# Patient Record
Sex: Male | Born: 1950 | Race: Black or African American | Hispanic: No | Marital: Married | Smoking: Never smoker
Health system: Southern US, Community
[De-identification: ages and names within clinical notes are randomized; demographics above are authoritative.]

## PROBLEM LIST (undated history)

## (undated) DIAGNOSIS — C801 Malignant (primary) neoplasm, unspecified: Secondary | ICD-10-CM

## (undated) HISTORY — PX: COLONOSCOPY: SHX174

## (undated) HISTORY — PX: JOINT REPLACEMENT: SHX530

---

## 2008-03-05 ENCOUNTER — Inpatient Hospital Stay (HOSPITAL_COMMUNITY): Admission: RE | Admit: 2008-03-05 | Discharge: 2008-03-08 | Payer: Self-pay | Admitting: Orthopedic Surgery

## 2008-04-16 ENCOUNTER — Inpatient Hospital Stay (HOSPITAL_COMMUNITY): Admission: RE | Admit: 2008-04-16 | Discharge: 2008-04-19 | Payer: Self-pay | Admitting: Orthopedic Surgery

## 2008-04-23 ENCOUNTER — Emergency Department: Payer: Self-pay | Admitting: Emergency Medicine

## 2008-04-29 ENCOUNTER — Ambulatory Visit (HOSPITAL_BASED_OUTPATIENT_CLINIC_OR_DEPARTMENT_OTHER): Admission: RE | Admit: 2008-04-29 | Discharge: 2008-04-30 | Payer: Self-pay | Admitting: Orthopedic Surgery

## 2009-09-13 IMAGING — CR DG CHEST 2V
2 series · 2 of 2 positions shown · non-contrast
Comparison: None

CLINICAL DATA: DJD.  Preadmission for knee replacement

CHEST - 2 VIEW

[view not recorded (1 of 2)]
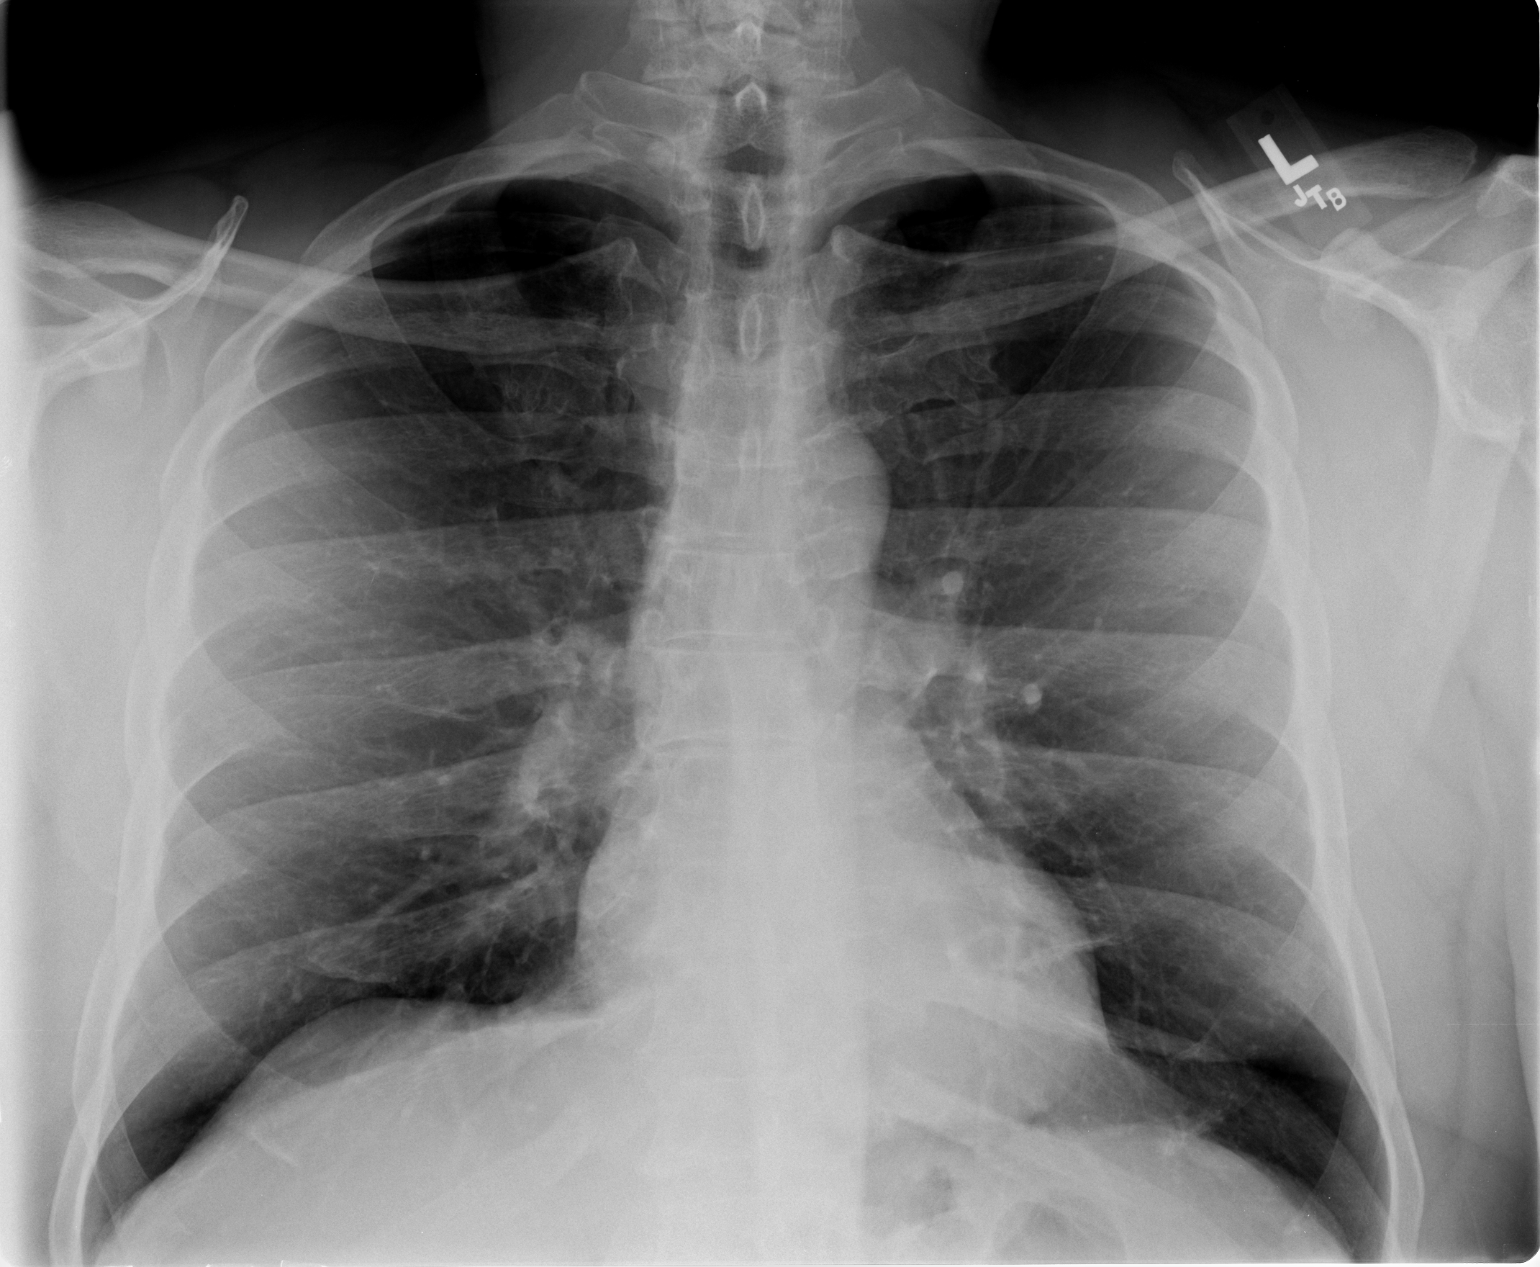

[view not recorded (2 of 2)]
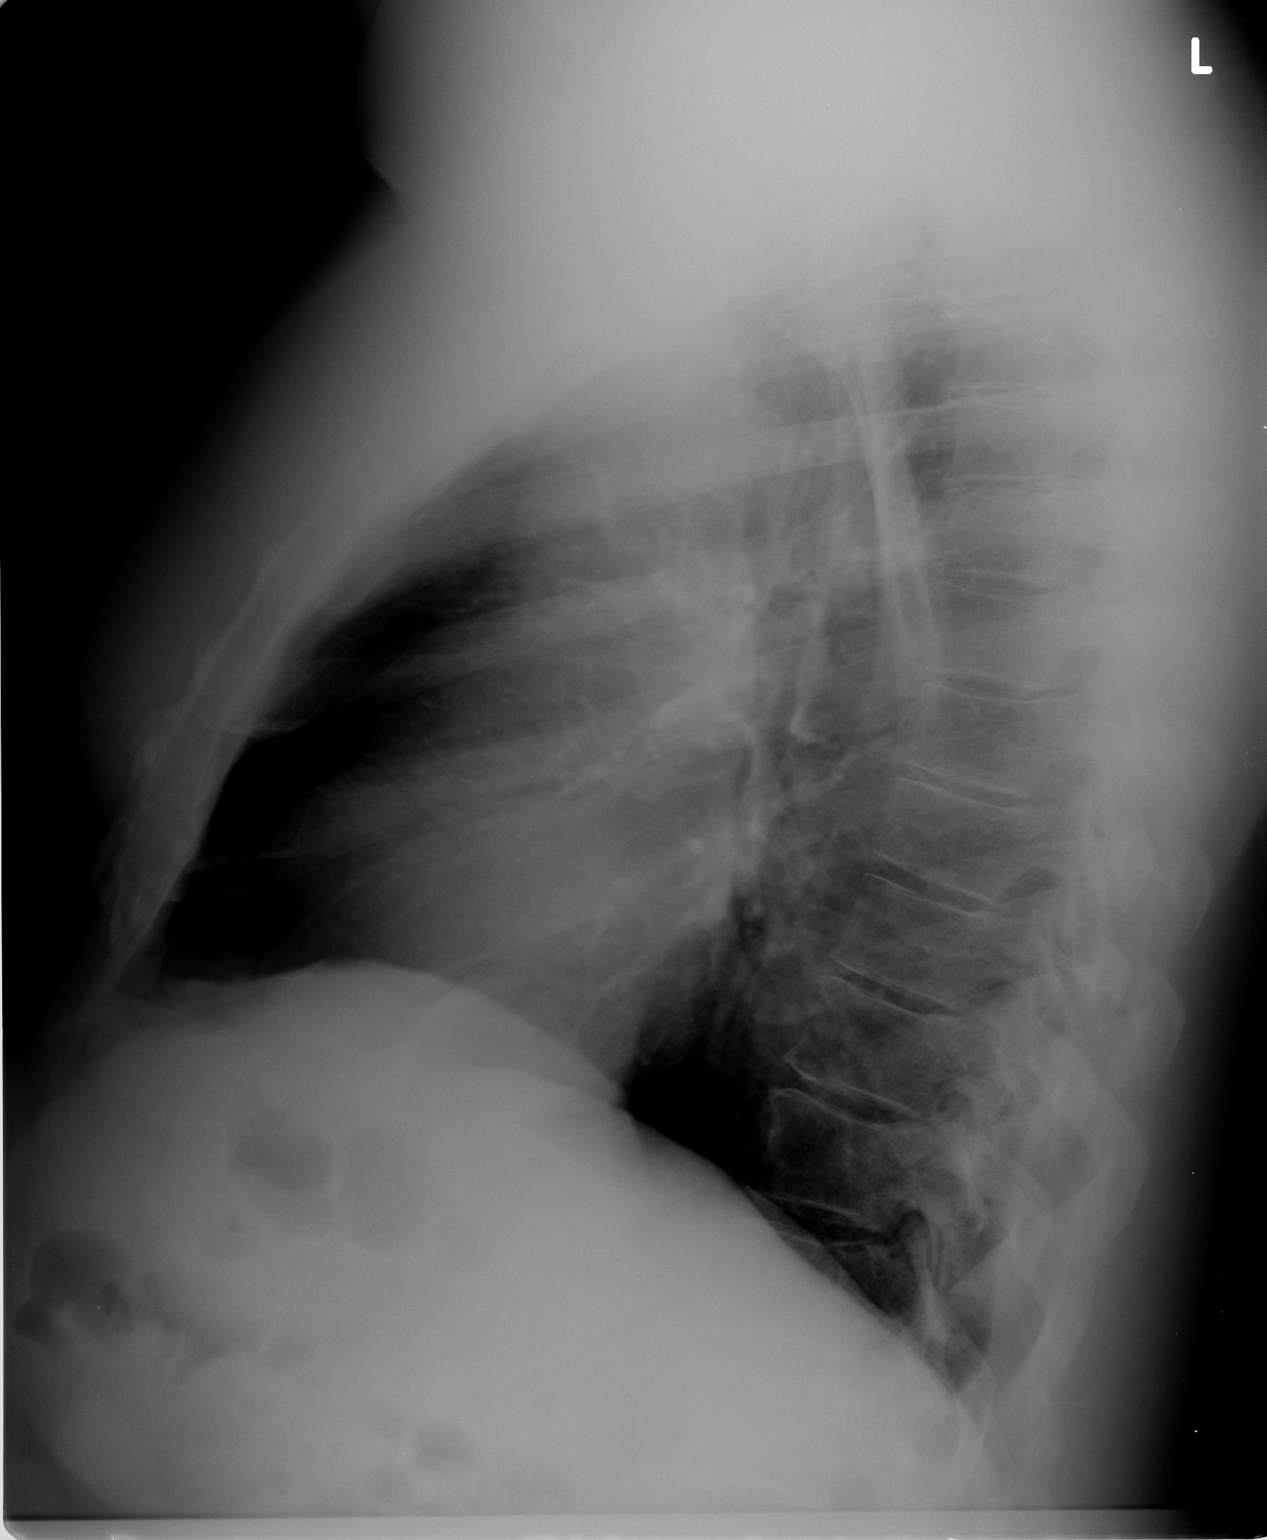

[2 of 2 positions shown; findings below may reference images not displayed]

FINDINGS: The heart and mediastinal contours are within normal
limits.  Both lungs are well expanded and clear.  No focal airspace
opacity, effusion, edema or pneumothorax is identified.  The bony
thorax is unremarkable.
IMPRESSION: No evidence of acute cardiopulmonary disease.

## 2009-09-18 IMAGING — CR DG KNEE 1-2V PORT*L*
2 series · 2 of 2 positions shown · non-contrast
Comparison: None

CLINICAL DATA: Left knee replacement.

PORTABLE LEFT KNEE - 1-2 VIEW

[view not recorded (1 of 2)]
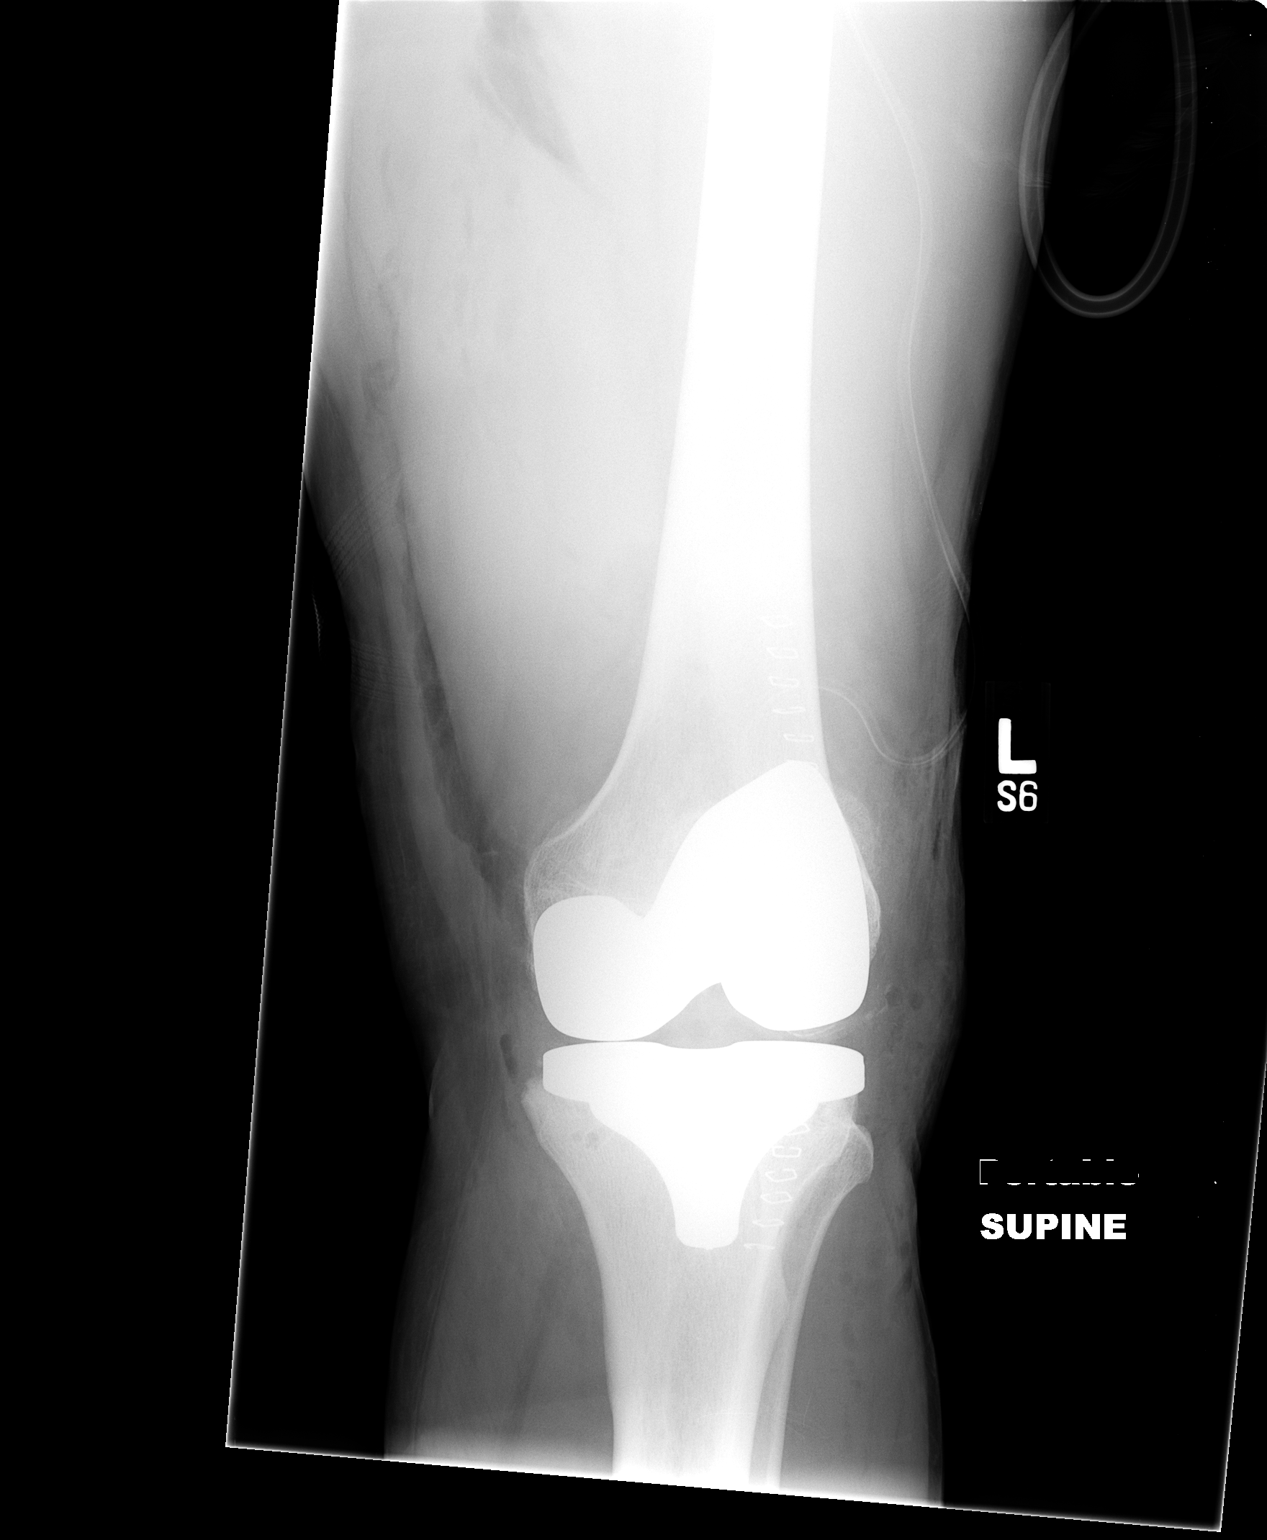

[view not recorded (2 of 2)]
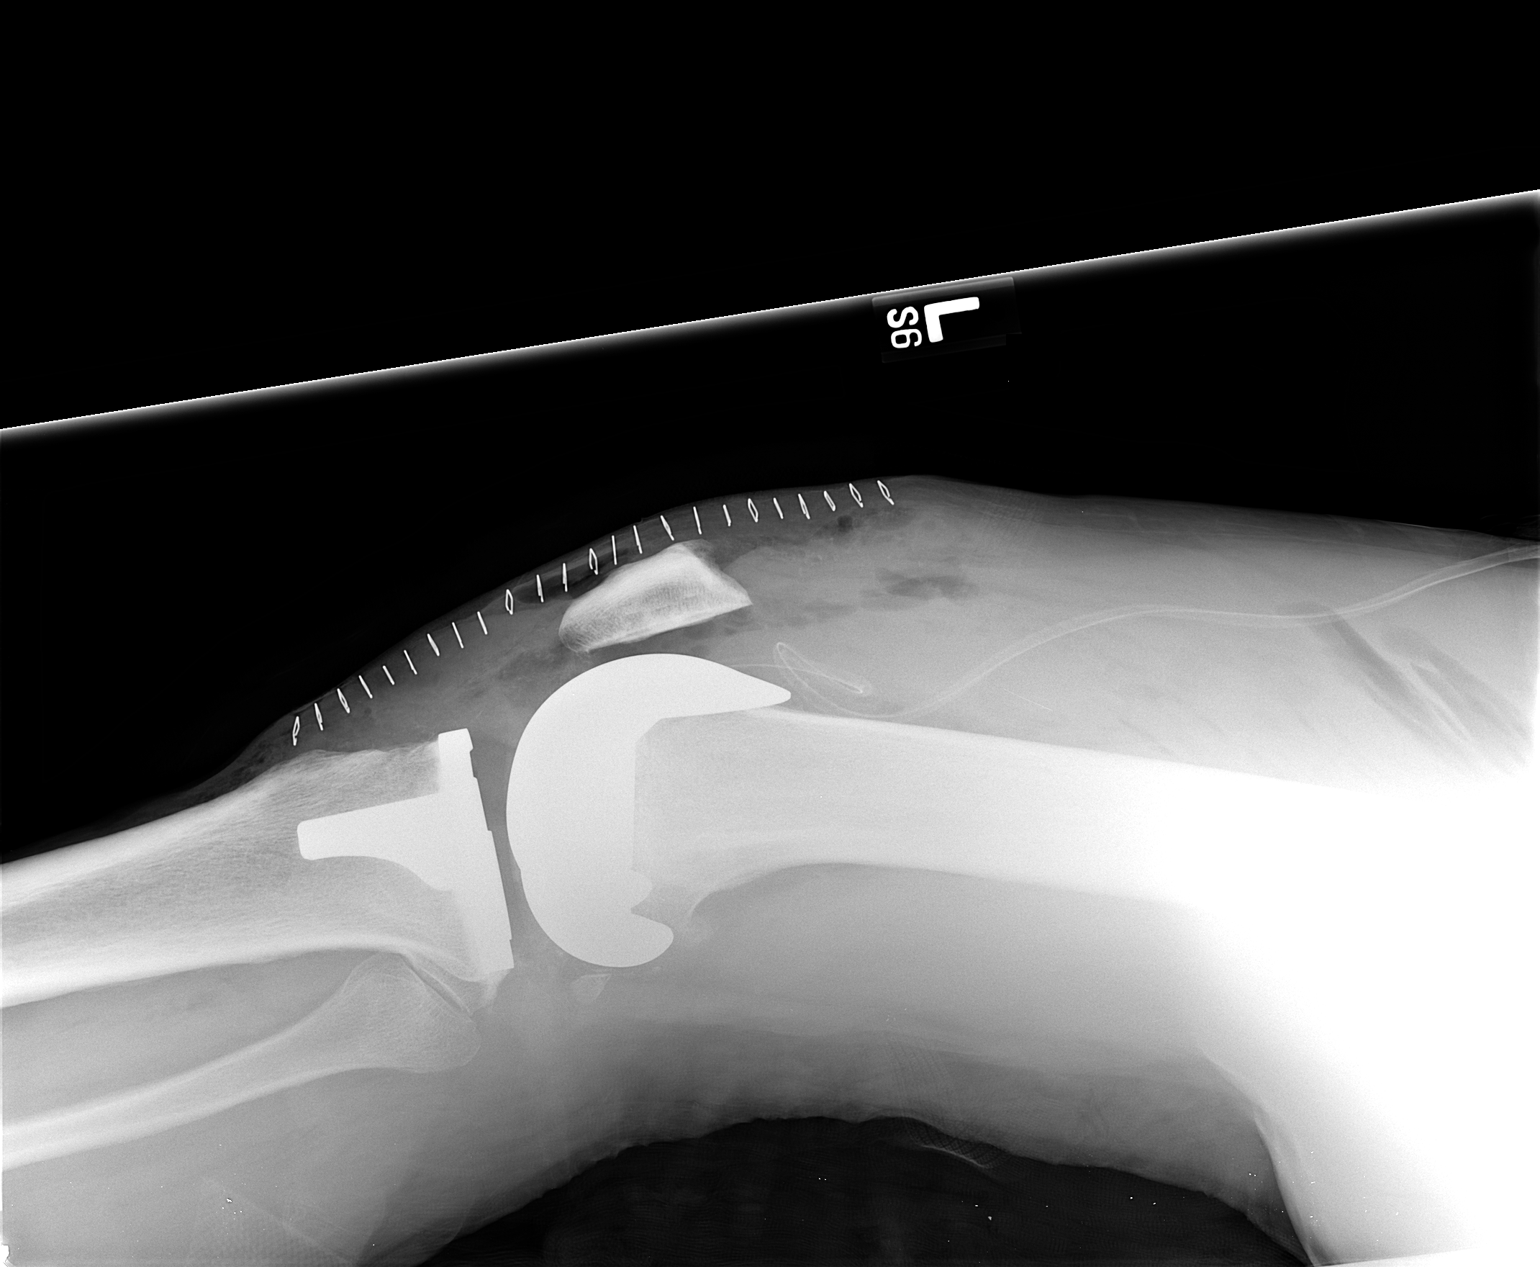

[2 of 2 positions shown; findings below may reference images not displayed]

FINDINGS: The patient is status post left knee replacement.  Joint
and soft tissue gas noted.  Soft tissue drain in place.  No bony
complicating feature.
IMPRESSION: Left knee replacement without complicating feature.

## 2009-10-30 IMAGING — CR DG KNEE 1-2V PORT*R*
2 series · 2 of 2 positions shown · non-contrast
Comparison: None

CLINICAL DATA: Right knee replacement.

PORTABLE RIGHT KNEE - 1-2 VIEW

[ap/obl knee]
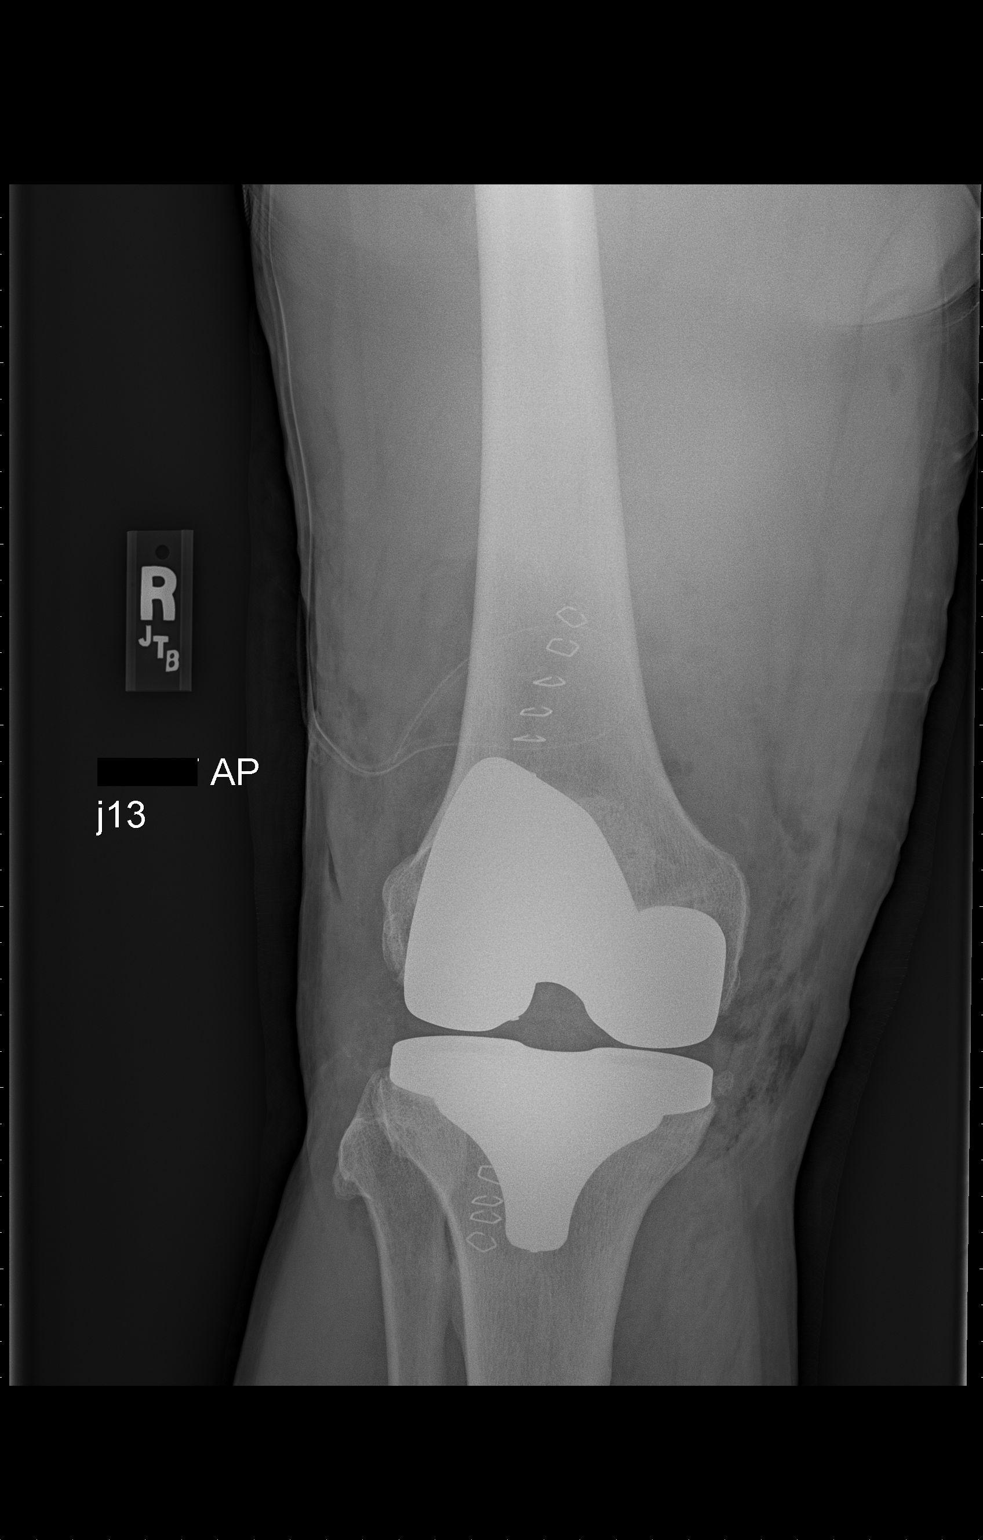

[knee lat]
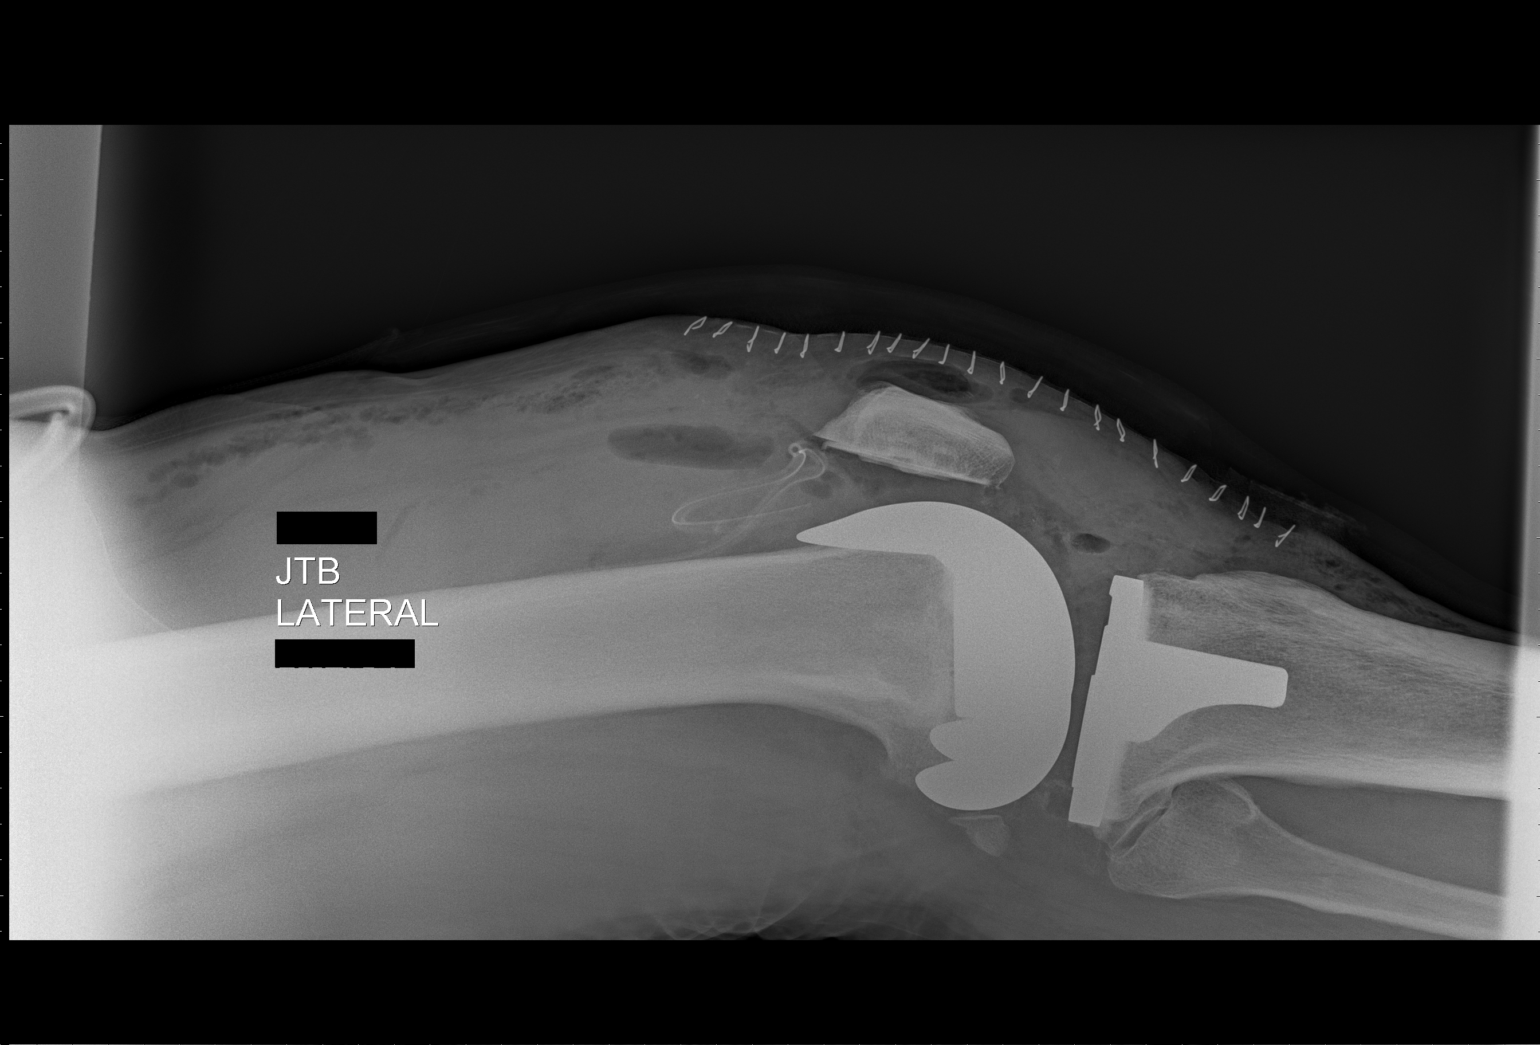

[2 of 2 positions shown; findings below may reference images not displayed]

FINDINGS: The patient is status post right total knee arthroplasty.
Surgical drain and skin staples are in place.  Fluid and air are
seen in the knee joint and overlying soft tissues.
IMPRESSION: Right total knee arthroplasty without immediate complicating
feature.

## 2010-12-07 NOTE — Op Note (Signed)
[name], [name]                 ACCOUNT NO.:  [id number]   MEDICAL RECORD NO.:  [id number]          PATIENT TYPE:  INP   LOCATION:  5028                         FACILITY:  [facility]   PHYSICIAN:  Loreta Ave, M.D. DATE OF BIRTH:  [birth year]   DATE OF PROCEDURE:  03/05/2008  DATE OF DISCHARGE:                               OPERATIVE REPORT   PREOPERATIVE DIAGNOSES:  1. End-stage degenerative arthritis, left knee.  2. 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   POSTOPERATIVE DIAGNOSIS:  1. End-stage degenerative arthritis, left knee.  2. 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   PROCEDURE:  Left total knee replacement utilizing Stryker triathlon  prosthesis.  Modified minimally invasive approach.  Soft tissue  balancing with medial capsule release.  Cemented pegged posterior  stabilized #7 femoral component.  Cemented #7 tibial component with a 11-  mm posterior stabilized polyethylene insert.  Cemented pegged medial  offset resurfacing 40-mm patellar component.   SURGEON:  Loreta Ave, MD   ASSISTANT:  Genene Churn. Barry Dienes, [place], present throughout the entire case  necessary for timely completion of procedure.   ANESTHESIA:  General.   BLOOD LOSS:  Minimal.   SPECIMENS:  None.   CULTURES:  None.   COMPLICATIONS:  None.   DRESSINGS:  Soft compressor with knee immobilizer.   DRAINS:  Hemovac x1.   TOURNIQUET TIME:  1 hour and 30 minutes.   PROCEDURE:  The patient brought to the operating room, placed on the  operating table in supine position.  After adequate anesthesia had been  obtained, left knee examined.  About 7 degrees or more flexion  contracture.  Alignment and more than 7 degrees of varus barely  correctable to neutral.  Further flexion barely 90 degrees.  Tourniquet  applied.  Prepped and draped in usual sterile fashion.  Exsanguinated  with elevation and Esmarch, tourniquet  inflated to 350 mmHg.  Straight  incision above the patella down to tibial tubercle.  Medial arthrotomy  to the superomedial border of the patella and then vastus splitting  preserving quad tendon.  Knee exposed.  Medial soft tissue released.  Extensive periarticular spurs, remnants of menisci, cruciate ligament,  loose bodies all removed.  Distal femur exposed.  Intramedullary guide  placed.  I did a 12-mm resection distal femur, taking more of lateral  than medial because of the medial defect.  Epicondylar axis marked.  Size #7 component.  Appropriate jigs put in place where it was sized,  cut, and then fitted for a #7 posterior stabilized femoral component,  which gave good coverage, alignment, and fitting.  Trial removed.  Attention turned to the tibia.  Extramedullary guide.  0-degree cut.  Resection down below the defect on the medial side.  Good bone stock.  Size #7 component.  Periarticular spurs removed.  Some thickened  subchondral cortical bone on the medial side treated with multiple  drilling.  Recess examined all spurs, all loose bodies removed.  Trials  put in place.  #7 on  the femur, #7 on the tibia which gave good fitting,  alignment, and coverage.  With the 11-mm insert and with the soft tissue  balancing, I had good stability in flexion/extension, good mechanical  axis, and good motion.  Patella was exposed.  The posterior 11 mm was  removed.  Periarticular spurs removed.  Drilled and sized and fitted for  a 40-mm patellar component with excellent patellofemoral tracking at  completion.  Tibia was marked for appropriate rotation and then hand  reamed for definitive components.  All trials have been removed.  Copious irrigation with a pulse irrigating device.  Cement was then  prepared and placed on all components.  The tibial and femoral  components hammered in place and excessive cement removed.  Polyethylene  attached to tibia.  Patellar component firmly cemented.   Once the cement  hardened, the knee was reexamined.  A little soft tissue redundancy in  lateral gutter causing some popping that was debrided out.  Once that  was complete, I was very pleased with the alignment and stability.  Full  extension, just slight hyperextension, full flexion, good stability,  good patellofemoral tracking.  Wound irrigated.  Hemovac placed and  brought out through a separate stab wound.  Arthrotomy closed with #1  Vicryl.  Skin and subcutaneous tissue with Vicryl and staples.  Knee  injected with Marcaine.  Hemovac clamp.  Sterile compressive dressing  applied.  Tourniquet deflated and removed.  Knee immobilizer applied.  Anesthesia reversed.  Brought to recovery room.  Tolerated surgery well.  No complications.      Loreta Ave, M.D.  Electronically Signed     DFM/MEDQ  D:  03/05/2008  T:  03/06/2008  Job:  [id number]

## 2010-12-07 NOTE — Op Note (Signed)
[name], [name]                 ACCOUNT NO.:  [id number]   MEDICAL RECORD NO.:  [id number]          PATIENT TYPE:  AMB   LOCATION:  DSC                          FACILITY:  [facility]   PHYSICIAN:  Loreta Ave, M.D. DATE OF BIRTH:  Oct 25, 1950   DATE OF PROCEDURE:  04/29/2008  DATE OF DISCHARGE:                               OPERATIVE REPORT   PREOPERATIVE DIAGNOSIS:  Status post right total knee replacement with  postop significant hemarthrosis and clotting with tense effusion  secondary to Coumadin therapy.   POSTOPERATIVE DIAGNOSIS:  Status post right total knee replacement with  postop significant hemarthrosis and clotting with tense effusion  secondary to Coumadin therapy.   PROCEDURE:  Right knee exam with extensive lavage, evacuation of blood  and clotted hematoma arthroscopically.  Manipulation.  Removal of  staples.   SURGEON:  Loreta Ave, MD   ASSISTANT:  Genene Churn. Barry Dienes, [place]   ANESTHESIA:  General.   BLOOD LOSS:  Minimal.   TOURNIQUET:  Not employed.   SPECIMENS:  None.   CULTURE:  The bloody aspirate was sent for aerobic and anaerobic  culture.   COMPLICATIONS:  None.   DRESSINGS:  Soft compressive.   PROCEDURE:  The patient brought to the operating room, placed on the  operating table in supine position.  After adequate anesthesia had been  obtained, right knee examined.  A relatively tense effusion.  Almost  full extension, flexion only about 30 degrees limited by swelling.  Knee  stable.  Prepped and draped in usual sterile fashion.  Three portals  created, 1 superolateral, 1 each medial, lateral, parapatellar.  Inflow  catheter introduced and this evacuated some blood, but not lot.  Arthroscope introduced, knee inspected.  Extensive clots throughout the  entire knee.  This was evacuated utilizing the shaver device of the  cannula system from the arthroscope.  Extensive clots throughout all  recesses from the suprapatellar pouch all way down to  the medial and  lateral gutters and throughout the knee evacuated.  Nothing that looked  like purulence at all.  This all appeared to have been a bleed from his  Coumadin, which got too elevated and then was brought back down to  reasonable levels causing the hemarthrosis to clot off.  All components  looked good.  Once I had thoroughly and convincingly evacuated all clots  available throughout the knee, it was reexamined.  Marked improve in  appearance, not tense at all.  I could then gently manipulate the knee  going from full extension to 125 degrees of flexion very easily.  Good  stability.  Arthroscope reintroduced.  Further clots all evaluated.  Once I was convinced, this was thoroughly cleaned out.  Hemovac was  placed through the  superolateral portal and sewn in place.  The knee injected Marcaine and  Hemovac clamp.  The other portal was closed.  We then removed all of the  staples and put on Steri-Strips.  Sterile compressive dressing applied.  Anesthesia reversed.  Brought to recovery room.  Tolerated the surgery  well without complications.  Loreta Ave, M.D.  Electronically Signed     DFM/MEDQ  D:  04/29/2008  T:  04/30/2008  Job:  [id number]

## 2010-12-07 NOTE — Op Note (Signed)
NAMEFALCON, MCCASKEY                 ACCOUNT NO.:  [id number]   MEDICAL RECORD NO.:  [id number]          PATIENT TYPE:  INP   LOCATION:  5019                         FACILITY:  [facility]   PHYSICIAN:  Loreta Ave, M.D. DATE OF BIRTH:  [birth year]   DATE OF PROCEDURE:  04/16/2008  DATE OF DISCHARGE:                               OPERATIVE REPORT   PREOPERATIVE DIAGNOSES:  1. End-stage degenerative arthritis, right knee.  Varus alignment,      flexion contracture, limited flexion.  2. 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   POSTOPERATIVE DIAGNOSES:  1. End-stage degenerative arthritis, right knee.  Varus alignment,      flexion contracture, limited flexion.  2. 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   PROCEDURES:  1. Right knee total knee replacement with Stryker Triathlon      components.  Modified minimally invasive approach.  Cemented pegged      posterior stabilized #7 femoral component.  Cemented #7 tibial      component with an 11-mm polyethylene insert.  Resurfacing 40 mm x      11 mm pegged cemented medial offset patellar component.  Extensive      removal of loose bodies and soft tissue release.  2. Exam, manipulation, injection, left knee.   SURGEON:  Loreta Ave, MD   ASSISTANT:  Genene Churn. Barry Dienes, [place], present throughout the entire case  necessary for timely completion of the procedure.   ANESTHESIA:  General.   ESTIMATED BLOOD LOSS:  Minimal.   SPECIMENS:  None.   CULTURES:  None.   COMPLICATIONS:  None.   DRESSING:  Soft compressive with knee immobilizer on the right.   TOURNIQUET TIME:  1 hour and 20 minutes on the right.   PROCEDURE:  The patient brought to the operating room and after adequate  anesthesia had been obtained, first attention turned to the left knee.  Recent total knee replacement.  Motion 5-90 degrees.  Early adhesions  really limiting rehab.  Gently manipulated achieving full extension to  130 degrees of flexion.  Maintained stable knee.  Under sterile  technique, aspirated for blood, injected intra-articularly with  Marcaine.  Attention turned to the opposite, right knee.  Tourniquet  applied.  Prepped and draped in the usual sterile fashion.  Exsanguinated with elevation and Esmarch, tourniquet inflated to 350  mmHg.  Straight incision above the patella down to the tibial tubercle.  At the start, he was noted to have a more than 5 degrees flexion; more  than 5 degrees varus, not correctable; further flexion barely 90  degrees.  Anterior incision completed.  Medial arthrotomy vastus  splitting preserving quad tendon.  Knee exposed.  Extensive loose  bodies, periarticular spurs, remnants of menisci, cruciate ligaments  excised.  Distal femur exposed.  Intramedullary guide placed.  Distal  cut 12 mm resected to set a 5 degrees of valgus.  Epicondylar axis  marked.  Sized, cut, and fitted for a posterior stabilized #7 femoral  component.  Care taken to remove the loose bodies and osteophytes in  back.  Attention turned to the tibia.  Extramedullary guide.  A 3-degree  posterior slope cut below the defect, which was primarily medial.  Sized  to #7 component.  All recess examined.  All remnants of menisci removed.  A #7 component on the femur, #7 on the tibia.  After soft tissue  balancing, generous medial capsule release, and utilizing an 11-mm  component, I had a little hyperextension, full flexion, good alignment,  good stability.  I intentionally left a little looser on this knee than  the other one because of the evidence of early scarring he was already  getting in the opposite, left knee.  Patella was measured, exposed, and  spurs removed.  Posterior 11 mm was removed.  Drilled, sized, and fitted  for 40-mm component, which had excellent tracking.  All trials were  removed after the tibia was marked for rotation.  Tibia was hand reamed.  Copious irrigation with a pulse  irrigating device.  Cement prepared,  placed on all components, which were firmly seated.  Polyethylene  attached to tibia.  Knee reduced.  Patellar component held in place and  clamped.  Once the cement hardened, it was reexamined.  Full extension,  full flexion, good alignment, good stability, good patellofemoral  tracking.  Hemovac placed through a separate stab wound and brought  outside the knee.  Arthrotomy closed with #1 Vicryl.  Skin and  subcutaneous tissue with Vicryl and staples.  Knee injected with  Marcaine.  Hemovac clamped.  Sterile compressive dressing applied.  Tourniquet deinflated, removed. Knee immobilizer applied.  Anesthesia  reversed.  Brought to the recovery room.  Tolerated the surgery well.  No complications.      Loreta Ave, M.D.  Electronically Signed     DFM/MEDQ  D:  04/16/2008  T:  04/17/2008  Job:  [id number]

## 2010-12-10 NOTE — Discharge Summary (Signed)
[name], [name]                 ACCOUNT NO.:  [id number]   MEDICAL RECORD NO.:  [id number]          PATIENT TYPE:  INP   LOCATION:  5019                         FACILITY:  [facility]   PHYSICIAN:  [name], M.D. DATE OF BIRTH:  [birth year]   DATE OF ADMISSION:  04/16/2008  DATE OF DISCHARGE:  04/19/2008                               DISCHARGE SUMMARY   FINAL DIAGNOSES:  1. Status post right total knee replacement for end-stage degenerative      joint disease.  2. Status post left knee manipulation under anesthesia for      arthrofibrosis.   HISTORY OF PRESENT ILLNESS:  A 60 year old black male with history of  end-stage DJD right knee and chronic pain along with left knee pain and  stiffness, presented to our office for preop evaluation for right total  knee replacement and left knee manipulation under anesthesia.  He was  status post left total knee replacement on March 05, 2008.   HOSPITAL COURSE:  On April 16, 2008, the patient was taken to the  [facility] OR and a right total knee replacement and left knee  manipulation under anesthesia procedure was performed.   SURGEON:  Johnathan Ave MD   ASSISTANT:  Johnathan Churn. Denton Garrett..   ANESTHESIA:  General.   SPECIMEN:  None.   ESTIMATED BLOOD LOSS:  200 mL in the right knee.   DRAINS:  One Hemovac drain, right knee placed.   TOURNIQUET TIME:  Right knee, 1 hour 40 min.   COMPLICATIONS:  There were no surgical or anesthesia complications, and  the patient was transferred to the recovery room in stable condition.   On April 17, 2008, the patient was doing well with pain control.  Temperature 99.2, pulse 85, respirations 18, blood pressure 99/67.  Hemoglobin 10.8, hematocrit 32.2, INR 1.3.  UA positive for mucus and  bacteria.  Started on Cipro 500 mg p.o. b.i.d.  Serum glucose 114 and  hemoglobin A1c ordered.  No previous diagnosis of diabetes.  Pharmacy  protocol Coumadin started.  PT/OT consults.  On  April 18, 2008, the  patient doing well, good pain control.  Denied chest pain, shortness of  breath.  Did well with PT.  Temperature 99.5, pulse 96, respirations 20,  blood pressure 120/75.  Hemoglobin 9.6, hematocrit 28.6.  Sodium 136,  potassium 4.5, chloride 100, CO2 28, BUN 5, creatinine 1.07.  Glucose  107.  INR 1.7.  Hemoglobin A1c within normal limits at 5.1.  Urine  culture had no growth.  Wound was good, staples are intact.  No drainage  or signs of infection.  Calf, nontender.  Neurovascularly intact.  Hemovac drain discontinued.  Discontinue the PCA and saline lock IV.  On  April 19, 2008, the patient doing well.  He states he is ready to go  home.  Temperature 99.3, pulse 79, respirations 18, blood pressure  116/54.  Hemoglobin 9.1, hematocrit 27.1, INR 1.8.  Wound was good and  staples intact.  No drainage or signs of infection.  Calf nontender,  neurovascularly intact.   MEDICATIONS:  1.  Percocet 10/325, 1-2 tablets p.o. q.4-6 h. p.r.n. pain.  2. Robaxin 500 mg 1 tablet p.o. q.6 h. p.r.n. spasms.  3. Coumadin pharmacy protocol.  4. Lovenox 30 mg 1 subcu injection b.i.d. x3 days and stop when      Coumadin is therapeutic with INR of 2-3.  5. Cipro 500 mg 1 tablet p.o. b.i.d. x3 days.   CONDITION:  Good and stable.   DISPOSITION:  Discharged home.   DISCHARGE INSTRUCTIONS:  The patient will work with home health PT and  OT to improve bilateral knee range of motion and strengthening.  Daily  dressing changes at right knee with 4 x 4 gauze and tape.  Okay to  shower, but no tub soaking.  Follow up in the office when he is 2 weeks  postop for recheck.  Return sooner if needed.      Johnathan Churn. Denton Garrett.      [name], M.D.  Electronically Signed    JMO/MEDQ  D:  06/11/2008  T:  06/12/2008  Job:  [id number]

## 2010-12-10 NOTE — Discharge Summary (Signed)
[name], [name]                 ACCOUNT NO.:  [id number]   MEDICAL RECORD NO.:  [id number]          PATIENT TYPE:  INP   LOCATION:  5028                         FACILITY:  [facility]   PHYSICIAN:  Loreta Ave, M.D. DATE OF BIRTH:  [birth year]   DATE OF ADMISSION:  03/05/2008  DATE OF DISCHARGE:  03/08/2008                               DISCHARGE SUMMARY   FINAL DIAGNOSIS:  Status post left total knee replacement for end-stage  degenerative joint disease.   HISTORY OF PRESENT ILLNESS:  A 60 year old black male with history of  end-stage DJD left knee and chronic pain presented to our office for  preoperative evaluation for a total knee replacement.  He had  progressive worsening pain with failed response to conservative  treatment.  Significant decrease in daily activities due to the ongoing  complaint.   HOSPITAL COURSE:  On March 05, 2008, the patient was taken to the [facility] OR and a left total knee replacement procedure was performed.   SURGEON:  Loreta Ave, MD   ASSISTANT:  Shary Key, PA-C.   ANESTHESIA:  General.   SPECIMENS:  None.   ESTIMATED BLOOD LOSS:  Minimal.   TOURNIQUET TIME:  1 hour 55 minutes.   DRAINS:  One Hemovac drain placed.   There were no surgical or anesthesia complications, and the patient was  transferred to recovery room in stable condition.  Started Lovenox for  postop DVT prophylaxis.  On March 05, 2008, the patient doing well with  good control.  Vital signs stable and afebrile.  Hemoglobin 12.9 and  hematocrit 37.9.  INR 1.1.  Dressing clean, dry, and intact.  Calf  nontender and neurovascularly intact.  PT was started.  On March 07, 2008, the doing well with good pain control.  Ambulated 180 feet.  Temperature 99.5, pulse 91, respirations 18, and blood pressure 132/82.  WBC 9.8, hematocrit 32.4, hemoglobin 11.5, and platelets of 121.  Wound  looks good and staples intact.  No drainage or signs of infection.  Hemovac  drain discontinued.  Calf nontender and neurovascularly intact.  Discontinued PCA and saline locked IV.  On March 12, 2008, the patient  doing very well.  He is ready to discharge home.  Vital signs stable and  afebrile.  Hemoglobin 10.8 and hematocrit 31.3.  Sodium 136, potassium  4.0, chloride 99, CO2 of 29, BUN 10, creatinine 1.26, and glucose 101.  Wound looks good and staples intact.  No drainage or signs of infection.  Calf nontender and neurovascularly intact.   DISPOSITION:  Discharged home.   CONDITION:  Good and stable.   DISCHARGE MEDICATIONS:  1. IV Percocet 10/325, 1-2 tablets p.o. q. 4-6 hours for pain.  2. Robaxin 500 mg one tab p.o. q.6 hours p.r.n. for spasms.  3. Lovenox 30 mg one subcu injection b.i.d. x4 weeks postop for DVT      prophylaxis.   INSTRUCTIONS:  The patient will work with home health PT and OT to  improve ambulation, knee range of motion, and strengthening.  Weightbearing as tolerated.  Daily dressing changes  with 4 x 4 gauze and  tape.  He is okay to shower.  Follow up in the office in 2 weeks  postoperative for recheck.  Return sooner if needed.      Genene Churn. Denton Meek.      Loreta Ave, M.D.  Electronically Signed    JMO/MEDQ  D:  05/14/2008  T:  05/15/2008  Job:  [id number]

## 2011-04-22 LAB — COMPREHENSIVE METABOLIC PANEL
Alkaline Phosphatase: 75
BUN: 14
Chloride: 106
GFR calc non Af Amer: >60
Glucose, Bld: 71
Potassium: 4.7
Total Bilirubin: 1

## 2011-04-22 LAB — TYPE AND SCREEN: Antibody Screen: NEGATIVE

## 2011-04-22 LAB — PROTIME-INR
INR: 1
INR: 1.1
Prothrombin Time: 13.1
Prothrombin Time: 14.6

## 2011-04-22 LAB — CBC
HCT: 33.4 — ABNORMAL LOW
HCT: 47
Hemoglobin: 15.9
MCHC: 34.1
Platelets: 121 — ABNORMAL LOW
Platelets: 147 — ABNORMAL LOW
RBC: 3.84 — ABNORMAL LOW
RDW: 12.6
WBC: 5.6
WBC: 7.3
WBC: 9.8

## 2011-04-22 LAB — BASIC METABOLIC PANEL
BUN: 9
Calcium: 8.1 — ABNORMAL LOW
GFR calc non Af Amer: >60
Potassium: 3.9
Sodium: 131 — ABNORMAL LOW

## 2011-04-22 LAB — URINALYSIS, ROUTINE W REFLEX MICROSCOPIC
Bilirubin Urine: NEGATIVE
Protein, ur: NEGATIVE
Urobilinogen, UA: 0.2

## 2011-04-25 LAB — CBC
HCT: 27.1 — ABNORMAL LOW
HCT: 28.6 — ABNORMAL LOW
HCT: 32.2 — ABNORMAL LOW
HCT: 43.1
Hemoglobin: 14.6
Hemoglobin: 9.1 — ABNORMAL LOW
MCHC: 33.5
MCHC: 33.6
MCHC: 33.7
MCV: 96.6
MCV: 96.8
MCV: 96.9
Platelets: 128 — ABNORMAL LOW
Platelets: 144 — ABNORMAL LOW
RBC: 2.95 — ABNORMAL LOW
RBC: 3.32 — ABNORMAL LOW
RBC: 4.55
RDW: 12.9
RDW: 13.3
WBC: 6
WBC: 7

## 2011-04-25 LAB — URINE MICROSCOPIC-ADD ON

## 2011-04-25 LAB — COMPREHENSIVE METABOLIC PANEL
ALT: 48
Alkaline Phosphatase: 92
BUN: 9
CO2: 28
Chloride: 107
GFR calc non Af Amer: >60
Glucose, Bld: 109 — ABNORMAL HIGH
Potassium: 4.6
Total Bilirubin: 0.8

## 2011-04-25 LAB — BASIC METABOLIC PANEL
BUN: 6
CO2: 28
CO2: 28
CO2: 29
Calcium: 8.5
Chloride: 100
Chloride: 102
Creatinine, Ser: 1.07
Creatinine, Ser: 1.18
GFR calc Af Amer: >60
GFR calc non Af Amer: >60
GFR calc non Af Amer: >60
Glucose, Bld: 114 — ABNORMAL HIGH
Glucose, Bld: 128 — ABNORMAL HIGH
Potassium: 3.7
Potassium: 4.5
Sodium: 135
Sodium: 136

## 2011-04-25 LAB — PROTIME-INR
INR: 1.1
Prothrombin Time: 14.3
Prothrombin Time: 16.6 — ABNORMAL HIGH
Prothrombin Time: 21 — ABNORMAL HIGH

## 2011-04-25 LAB — URINE CULTURE
Colony Count: NO GROWTH
Culture: NO GROWTH

## 2011-04-25 LAB — URINALYSIS, MICROSCOPIC ONLY
Bilirubin Urine: NEGATIVE
Glucose, UA: NEGATIVE
Ketones, ur: NEGATIVE
Leukocytes, UA: NEGATIVE
Nitrite: NEGATIVE
Protein, ur: NEGATIVE
pH: 6.5

## 2011-04-25 LAB — GLUCOSE, CAPILLARY: Glucose-Capillary: 122 — ABNORMAL HIGH

## 2011-04-25 LAB — URINALYSIS, ROUTINE W REFLEX MICROSCOPIC
Bilirubin Urine: NEGATIVE
Glucose, UA: NEGATIVE
Hgb urine dipstick: NEGATIVE
Protein, ur: 30 — AB
Urobilinogen, UA: 0.2

## 2011-04-25 LAB — TYPE AND SCREEN
ABO/RH(D): O POS
Antibody Screen: NEGATIVE

## 2011-04-26 LAB — APTT: aPTT: 47 — ABNORMAL HIGH

## 2011-04-26 LAB — WOUND CULTURE: Culture: NO GROWTH

## 2011-04-26 LAB — POCT HEMOGLOBIN-HEMACUE: Hemoglobin: 11.4 — ABNORMAL LOW

## 2011-04-26 LAB — PROTIME-INR
INR: 2.4 — ABNORMAL HIGH
Prothrombin Time: 27.9 — ABNORMAL HIGH

## 2011-04-26 LAB — ANAEROBIC CULTURE

## 2015-01-01 DIAGNOSIS — J309 Allergic rhinitis, unspecified: Secondary | ICD-10-CM | POA: Insufficient documentation

## 2017-08-03 DIAGNOSIS — I10 Essential (primary) hypertension: Secondary | ICD-10-CM | POA: Insufficient documentation

## 2020-08-31 ENCOUNTER — Other Ambulatory Visit: Payer: Self-pay | Admitting: Urology

## 2020-08-31 DIAGNOSIS — C61 Malignant neoplasm of prostate: Secondary | ICD-10-CM

## 2020-09-14 ENCOUNTER — Encounter (HOSPITAL_COMMUNITY)
Admission: RE | Admit: 2020-09-14 | Discharge: 2020-09-14 | Disposition: A | Discharge status: Home / Self Care | Payer: Medicare PPO | Source: Ambulatory Visit | Attending: Urology | Admitting: Urology

## 2020-09-14 ENCOUNTER — Other Ambulatory Visit: Payer: Self-pay

## 2020-09-14 DIAGNOSIS — C61 Malignant neoplasm of prostate: Secondary | ICD-10-CM | POA: Diagnosis not present

## 2020-09-14 MED ORDER — TECHNETIUM TC 99M MEDRONATE IV KIT
20.0000 | PACK | Freq: Once | INTRAVENOUS | Status: AC | PRN
  Administered 2020-09-14: 21.5 via INTRAVENOUS

## 2020-09-25 ENCOUNTER — Other Ambulatory Visit: Payer: Self-pay | Admitting: Urology

## 2020-10-23 NOTE — Progress Notes (Signed)
DUE TO COVID-19 ONLY ONE VISITOR IS ALLOWED TO COME WITH YOU AND STAY IN THE WAITING ROOM ONLY DURING PRE OP AND PROCEDURE DAY OF SURGERY. THE 1 VISITOR  MAY VISIT WITH YOU AFTER SURGERY IN YOUR PRIVATE ROOM DURING VISITING HOURS ONLY!  YOU NEED TO HAVE A COVID 19 TEST ON_4/05/2021 ______ @_______ , THIS TEST MUST BE DONE BEFORE SURGERY,  COVID TESTING SITE [address] [place] [address], IT IS ON THE RIGHT GOING OUT [address] APPROXIMATELY  2 MINUTES PAST [employer] ON THE RIGHT. ONCE YOUR COVID TEST IS COMPLETED,  PLEASE BEGIN THE QUARANTINE INSTRUCTIONS AS OUTLINED IN YOUR HANDOUT.                [name]  10/23/2020   Your procedure is scheduled on:                        11/04/2020   Report to [facility] Main  Entrance   Report to admitting at    1000 AM     Call this number if you have problems the morning of surgery [phone]    Remember: Do not eat food , candy gum or mints :After Midnight. You may have clear liquids from midnight until 0900am   Clear liquid diet the day before surgery.   Magnesium Citrate one bottle at 12 noon day before surgery.     CLEAR LIQUID DIET   Foods Allowed                                                                       Coffee and tea, regular and decaf                              Plain Jell-O any favor except red or purple                                            Fruit ices (not with fruit pulp)                                      Iced Popsicles                                     Carbonated beverages, regular and diet                                    Cranberry, grape and apple juices Sports drinks like Gatorade Lightly seasoned clear broth or consume(fat free) Sugar, honey syrup   _____________________________________________________________________    BRUSH YOUR TEETH MORNING OF SURGERY AND RINSE YOUR MOUTH OUT, NO CHEWING GUM CANDY OR MINTS.     Take these medicines the morning  of surgery with A SIP OF WATER:  Nasal spray , Zyrtec, Flonase  DO  NOT TAKE ANY DIABETIC MEDICATIONS DAY OF YOUR SURGERY                               You may not have any metal on your body including hair pins and              piercings  Do not wear jewelry, make-up, lotions, powders or perfumes, deodorant             Do not wear nail polish on your fingernails.  Do not shave  48 hours prior to surgery.              Men may shave face and neck.   Do not bring valuables to the hospital. [place].  Contacts, dentures or bridgework may not be worn into surgery.  Leave suitcase in the car. After surgery it may be brought to your room.     Patients discharged the day of surgery will not be allowed to drive home. IF YOU ARE HAVING SURGERY AND GOING HOME THE SAME DAY, YOU MUST HAVE AN ADULT TO DRIVE YOU HOME AND BE WITH YOU FOR 24 HOURS. YOU MAY GO HOME BY TAXI OR UBER OR ORTHERWISE, BUT AN ADULT MUST ACCOMPANY YOU HOME AND STAY WITH YOU FOR 24 HOURS.  Name and phone number of your driver:  Special Instructions: N/A              Please read over the following fact sheets you were given: _____________________________________________________________________  [facility] - Preparing for Surgery Before surgery, you can play an important role.  Because skin is not sterile, your skin needs to be as free of germs as possible.  You can reduce the number of germs on your skin by washing with CHG (chlorahexidine gluconate) soap before surgery.  CHG is an antiseptic cleaner which kills germs and bonds with the skin to continue killing germs even after washing. Please DO NOT use if you have an allergy to CHG or antibacterial soaps.  If your skin becomes reddened/irritated stop using the CHG and inform your nurse when you arrive at Short Stay. Do not shave (including legs and underarms) for at least 48 hours prior to the first CHG shower.  You may shave your  face/neck. Please follow these instructions carefully:  1.  Shower with CHG Soap the night before surgery and the  morning of Surgery.  2.  If you choose to wash your hair, wash your hair first as usual with your  normal  shampoo.  3.  After you shampoo, rinse your hair and body thoroughly to remove the  shampoo.                           4.  Use CHG as you would any other liquid soap.  You can apply chg directly  to the skin and wash                       Gently with a scrungie or clean washcloth.  5.  Apply the CHG Soap to your body ONLY FROM THE NECK DOWN.   Do not use on face/ open  Wound or open sores. Avoid contact with eyes, ears mouth and genitals (private parts).                       Wash face,  Genitals (private parts) with your normal soap.             6.  Wash thoroughly, paying special attention to the area where your surgery  will be performed.  7.  Thoroughly rinse your body with warm water from the neck down.  8.  DO NOT shower/wash with your normal soap after using and rinsing off  the CHG Soap.                9.  Pat yourself dry with a clean towel.            10.  Wear clean pajamas.            11.  Place clean sheets on your bed the night of your first shower and do not  sleep with pets. Day of Surgery : Do not apply any lotions/deodorants the morning of surgery.  Please wear clean clothes to the hospital/surgery center.  FAILURE TO FOLLOW THESE INSTRUCTIONS MAY RESULT IN THE CANCELLATION OF YOUR SURGERY PATIENT SIGNATURE_________________________________  NURSE SIGNATURE__________________________________  ________________________________________________________________________

## 2020-10-28 ENCOUNTER — Encounter (HOSPITAL_COMMUNITY)
Admission: RE | Admit: 2020-10-28 | Discharge: 2020-10-28 | Disposition: A | Discharge status: Home / Self Care | Payer: Medicare PPO | Source: Ambulatory Visit | Attending: Urology | Admitting: Urology

## 2020-10-28 ENCOUNTER — Other Ambulatory Visit: Payer: Self-pay

## 2020-10-28 ENCOUNTER — Encounter (HOSPITAL_COMMUNITY): Payer: Self-pay

## 2020-10-28 DIAGNOSIS — Z01812 Encounter for preprocedural laboratory examination: Secondary | ICD-10-CM | POA: Diagnosis not present

## 2020-10-28 HISTORY — DX: Malignant (primary) neoplasm, unspecified: C80.1

## 2020-10-28 LAB — BASIC METABOLIC PANEL
Anion gap: 8 (ref 5–15)
BUN: 13 mg/dL (ref 8–23)
CO2: 25 mmol/L (ref 22–32)
Calcium: 9.3 mg/dL (ref 8.9–10.3)
Chloride: 105 mmol/L (ref 98–111)
Creatinine, Ser: 1.13 mg/dL (ref 0.61–1.24)
GFR, Estimated: >60 mL/min (ref >60)
Glucose, Bld: 96 mg/dL (ref 70–99)
Potassium: 4.5 mmol/L (ref 3.5–5.1)
Sodium: 138 mmol/L (ref 135–145)

## 2020-10-28 LAB — CBC
HCT: 44.5 % (ref 39.0–52.0)
Hemoglobin: 15.4 g/dL (ref 13.0–17.0)
MCH: 32.4 pg (ref 26.0–34.0)
MCHC: 34.6 g/dL (ref 30.0–36.0)
MCV: 93.5 fL (ref 80.0–100.0)
Platelets: 150 10*3/uL (ref 150–400)
RBC: 4.76 MIL/uL (ref 4.22–5.81)
RDW: 13.1 % (ref 11.5–15.5)
WBC: 4.6 10*3/uL (ref 4.0–10.5)
nRBC: 0 % (ref 0.0–0.2)

## 2020-10-28 NOTE — Progress Notes (Signed)
Anesthesia Review:  PCP: DR Derinda Late [facility]  Cardiologist : Chest x-ray : EKG : Echo : Stress test: Cardiac Cath :  Activity level: can do a flight of stairs without difficulty  Sleep Study/ CPAP : none  Fasting Blood Sugar :      / Checks Blood Sugar -- times a day:   Blood Thinner/ Instructions /Last Dose: ASA / Instructions/ Last Dose :

## 2020-11-02 ENCOUNTER — Other Ambulatory Visit (HOSPITAL_COMMUNITY)
Admission: RE | Admit: 2020-11-02 | Discharge: 2020-11-02 | Disposition: A | Discharge status: Home / Self Care | Payer: Medicare PPO | Source: Ambulatory Visit | Attending: Urology | Admitting: Urology

## 2020-11-02 DIAGNOSIS — Z20822 Contact with and (suspected) exposure to covid-19: Secondary | ICD-10-CM | POA: Diagnosis not present

## 2020-11-02 DIAGNOSIS — Z01812 Encounter for preprocedural laboratory examination: Secondary | ICD-10-CM | POA: Diagnosis present

## 2020-11-02 LAB — SARS CORONAVIRUS 2 (TAT 6-24 HRS): SARS Coronavirus 2: NEGATIVE

## 2020-11-04 ENCOUNTER — Ambulatory Visit (HOSPITAL_COMMUNITY): Payer: Medicare PPO | Admitting: Anesthesiology

## 2020-11-04 ENCOUNTER — Encounter (HOSPITAL_COMMUNITY)
Admission: RE | Disposition: A | Discharge status: Home / Self Care | Payer: Self-pay | Source: Home / Self Care | Attending: Urology

## 2020-11-04 ENCOUNTER — Other Ambulatory Visit: Payer: Self-pay

## 2020-11-04 ENCOUNTER — Observation Stay (HOSPITAL_COMMUNITY)
Admission: RE | Admit: 2020-11-04 | Discharge: 2020-11-05 | Disposition: A | Discharge status: Home / Self Care | Payer: Medicare PPO | Attending: Urology | Admitting: Urology

## 2020-11-04 ENCOUNTER — Encounter (HOSPITAL_COMMUNITY): Payer: Self-pay | Admitting: Urology

## 2020-11-04 DIAGNOSIS — Z96653 Presence of artificial knee joint, bilateral: Secondary | ICD-10-CM | POA: Diagnosis not present

## 2020-11-04 DIAGNOSIS — R9721 Rising PSA following treatment for malignant neoplasm of prostate: Secondary | ICD-10-CM | POA: Diagnosis present

## 2020-11-04 DIAGNOSIS — C61 Malignant neoplasm of prostate: Principal | ICD-10-CM | POA: Insufficient documentation

## 2020-11-04 DIAGNOSIS — K429 Umbilical hernia without obstruction or gangrene: Secondary | ICD-10-CM | POA: Insufficient documentation

## 2020-11-04 HISTORY — PX: UMBILICAL HERNIA REPAIR: SHX196

## 2020-11-04 HISTORY — PX: LYMPHADENECTOMY: SHX5960

## 2020-11-04 HISTORY — PX: ROBOT ASSISTED LAPAROSCOPIC RADICAL PROSTATECTOMY: SHX5141

## 2020-11-04 LAB — TYPE AND SCREEN
ABO/RH(D): O POS
Antibody Screen: NEGATIVE

## 2020-11-04 LAB — HEMOGLOBIN AND HEMATOCRIT, BLOOD
HCT: 42.2 % (ref 39.0–52.0)
Hemoglobin: 14.4 g/dL (ref 13.0–17.0)

## 2020-11-04 SURGERY — PROSTATECTOMY, RADICAL, ROBOT-ASSISTED, LAPAROSCOPIC
Anesthesia: General

## 2020-11-04 MED ORDER — DIPHENHYDRAMINE HCL 12.5 MG/5ML PO ELIX: 12.5000 mg | ORAL_SOLUTION | Freq: Four times a day (QID) | ORAL | Status: DC | PRN

## 2020-11-04 MED ORDER — ROCURONIUM BROMIDE 100 MG/10ML IV SOLN
INTRAVENOUS | Status: DC | PRN
  Administered 2020-11-04 (×2): 20 mg via INTRAVENOUS
  Administered 2020-11-04: 70 mg via INTRAVENOUS

## 2020-11-04 MED ORDER — ACETAMINOPHEN 500 MG PO TABS
ORAL_TABLET | ORAL | Status: AC
  Administered 2020-11-04: 1000 mg via ORAL
  Filled 2020-11-04: qty 2

## 2020-11-04 MED ORDER — MAGNESIUM CITRATE PO SOLN: 1.0000 | Freq: Once | ORAL | Status: DC

## 2020-11-04 MED ORDER — SULFAMETHOXAZOLE-TRIMETHOPRIM 800-160 MG PO TABS: 1.0000 | ORAL_TABLET | Freq: Two times a day (BID) | ORAL | 0 refills | Status: DC

## 2020-11-04 MED ORDER — MIDAZOLAM HCL 5 MG/5ML IJ SOLN
INTRAMUSCULAR | Status: DC | PRN
  Administered 2020-11-04: 2 mg via INTRAVENOUS

## 2020-11-04 MED ORDER — MIDAZOLAM HCL 5 MG/5ML IJ SOLN: INTRAMUSCULAR | Status: DC | PRN

## 2020-11-04 MED ORDER — SODIUM CHLORIDE (PF) 0.9 % IJ SOLN
INTRAMUSCULAR | Status: AC
  Filled 2020-11-04: qty 20

## 2020-11-04 MED ORDER — ONDANSETRON HCL 4 MG/2ML IJ SOLN
INTRAMUSCULAR | Status: DC | PRN
  Administered 2020-11-04: 4 mg via INTRAVENOUS

## 2020-11-04 MED ORDER — PHENYLEPHRINE 40 MCG/ML (10ML) SYRINGE FOR IV PUSH (FOR BLOOD PRESSURE SUPPORT)
PREFILLED_SYRINGE | INTRAVENOUS | Status: AC
  Filled 2020-11-04: qty 10

## 2020-11-04 MED ORDER — ACETAMINOPHEN 500 MG PO TABS: 1000.0000 mg | ORAL_TABLET | Freq: Once | ORAL | Status: AC

## 2020-11-04 MED ORDER — CEFAZOLIN SODIUM-DEXTROSE 2-4 GM/100ML-% IV SOLN
2.0000 g | INTRAVENOUS | Status: AC
  Administered 2020-11-04: 2 g via INTRAVENOUS

## 2020-11-04 MED ORDER — SUGAMMADEX SODIUM 200 MG/2ML IV SOLN
INTRAVENOUS | Status: DC | PRN
  Administered 2020-11-04: 200 mg via INTRAVENOUS

## 2020-11-04 MED ORDER — SODIUM CHLORIDE (PF) 0.9 % IJ SOLN
INTRAMUSCULAR | Status: DC | PRN
  Administered 2020-11-04: 10 mL

## 2020-11-04 MED ORDER — CEFAZOLIN SODIUM-DEXTROSE 2-4 GM/100ML-% IV SOLN
INTRAVENOUS | Status: AC
  Filled 2020-11-04: qty 100

## 2020-11-04 MED ORDER — LACTATED RINGERS IV SOLN: INTRAVENOUS | Status: DC

## 2020-11-04 MED ORDER — FENTANYL CITRATE (PF) 100 MCG/2ML IJ SOLN
INTRAMUSCULAR | Status: DC | PRN
  Administered 2020-11-04 (×2): 50 ug via INTRAVENOUS
  Administered 2020-11-04: 150 ug via INTRAVENOUS
  Administered 2020-11-04 (×2): 50 ug via INTRAVENOUS

## 2020-11-04 MED ORDER — DIPHENHYDRAMINE HCL 50 MG/ML IJ SOLN: 12.5000 mg | Freq: Four times a day (QID) | INTRAMUSCULAR | Status: DC | PRN

## 2020-11-04 MED ORDER — BUPIVACAINE LIPOSOME 1.3 % IJ SUSP
20.0000 mL | Freq: Once | INTRAMUSCULAR | Status: AC
  Administered 2020-11-04: 20 mL
  Filled 2020-11-04: qty 20

## 2020-11-04 MED ORDER — LIDOCAINE 2% (20 MG/ML) 5 ML SYRINGE
INTRAMUSCULAR | Status: AC
  Filled 2020-11-04: qty 5

## 2020-11-04 MED ORDER — KETAMINE HCL 10 MG/ML IJ SOLN
INTRAMUSCULAR | Status: AC
  Filled 2020-11-04: qty 1

## 2020-11-04 MED ORDER — ONDANSETRON HCL 4 MG/2ML IJ SOLN
INTRAMUSCULAR | Status: AC
  Filled 2020-11-04: qty 2

## 2020-11-04 MED ORDER — FENTANYL CITRATE (PF) 100 MCG/2ML IJ SOLN
INTRAMUSCULAR | Status: AC
  Filled 2020-11-04: qty 2

## 2020-11-04 MED ORDER — PROPOFOL 10 MG/ML IV BOLUS
INTRAVENOUS | Status: DC | PRN
  Administered 2020-11-04: 180 mg via INTRAVENOUS

## 2020-11-04 MED ORDER — KETAMINE HCL 10 MG/ML IJ SOLN
INTRAMUSCULAR | Status: DC | PRN
  Administered 2020-11-04: 50 mg via INTRAVENOUS
  Administered 2020-11-04 (×2): 10 mg via INTRAVENOUS

## 2020-11-04 MED ORDER — LACTATED RINGERS IR SOLN
Status: DC | PRN
  Administered 2020-11-04: 1000 mL

## 2020-11-04 MED ORDER — ONDANSETRON HCL 4 MG/2ML IJ SOLN: 4.0000 mg | INTRAMUSCULAR | Status: DC | PRN

## 2020-11-04 MED ORDER — HYDROCODONE-ACETAMINOPHEN 5-325 MG PO TABS: 1.0000 | ORAL_TABLET | Freq: Four times a day (QID) | ORAL | 0 refills | Status: DC | PRN

## 2020-11-04 MED ORDER — PHENYLEPHRINE 40 MCG/ML (10ML) SYRINGE FOR IV PUSH (FOR BLOOD PRESSURE SUPPORT)
PREFILLED_SYRINGE | INTRAVENOUS | Status: DC | PRN
  Administered 2020-11-04: 80 ug via INTRAVENOUS

## 2020-11-04 MED ORDER — CHLORHEXIDINE GLUCONATE 0.12 % MT SOLN
15.0000 mL | Freq: Once | OROMUCOSAL | Status: AC
  Administered 2020-11-04: 15 mL via OROMUCOSAL

## 2020-11-04 MED ORDER — ORAL CARE MOUTH RINSE: 15.0000 mL | Freq: Once | OROMUCOSAL | Status: AC

## 2020-11-04 MED ORDER — FENTANYL CITRATE (PF) 250 MCG/5ML IJ SOLN
INTRAMUSCULAR | Status: AC
  Filled 2020-11-04: qty 5

## 2020-11-04 MED ORDER — LIDOCAINE 2% (20 MG/ML) 5 ML SYRINGE
INTRAMUSCULAR | Status: DC | PRN
  Administered 2020-11-04: 100 mg via INTRAVENOUS

## 2020-11-04 MED ORDER — DEXTROSE-NACL 5-0.45 % IV SOLN: INTRAVENOUS | Status: DC

## 2020-11-04 MED ORDER — FENTANYL CITRATE (PF) 100 MCG/2ML IJ SOLN
25.0000 ug | INTRAMUSCULAR | Status: DC | PRN
  Administered 2020-11-04: 25 ug via INTRAVENOUS
  Administered 2020-11-04: 50 ug via INTRAVENOUS

## 2020-11-04 MED ORDER — PROMETHAZINE HCL 25 MG/ML IJ SOLN: 6.2500 mg | INTRAMUSCULAR | Status: DC | PRN

## 2020-11-04 MED ORDER — LACTATED RINGERS IV SOLN: INTRAVENOUS | Status: DC | PRN

## 2020-11-04 MED ORDER — ROCURONIUM BROMIDE 10 MG/ML (PF) SYRINGE
PREFILLED_SYRINGE | INTRAVENOUS | Status: AC
  Filled 2020-11-04: qty 10

## 2020-11-04 MED ORDER — BELLADONNA ALKALOIDS-OPIUM 16.2-60 MG RE SUPP: 1.0000 | Freq: Four times a day (QID) | RECTAL | Status: DC | PRN

## 2020-11-04 MED ORDER — BACITRACIN-NEOMYCIN-POLYMYXIN 400-5-5000 EX OINT: 1.0000 "application " | TOPICAL_OINTMENT | Freq: Three times a day (TID) | CUTANEOUS | Status: DC | PRN

## 2020-11-04 MED ORDER — DEXAMETHASONE SODIUM PHOSPHATE 4 MG/ML IJ SOLN
INTRAMUSCULAR | Status: DC | PRN
  Administered 2020-11-04: 10 mg via INTRAVENOUS

## 2020-11-04 MED ORDER — SODIUM CHLORIDE 0.9 % IV BOLUS
1000.0000 mL | Freq: Once | INTRAVENOUS | Status: AC
  Administered 2020-11-04: 1000 mL via INTRAVENOUS

## 2020-11-04 MED ORDER — PROPOFOL 10 MG/ML IV BOLUS
INTRAVENOUS | Status: AC
  Filled 2020-11-04: qty 20

## 2020-11-04 MED ORDER — OXYCODONE HCL 5 MG PO TABS
5.0000 mg | ORAL_TABLET | ORAL | Status: DC | PRN
  Administered 2020-11-04 – 2020-11-05 (×4): 5 mg via ORAL
  Filled 2020-11-04 (×4): qty 1

## 2020-11-04 MED ORDER — ACETAMINOPHEN 500 MG PO TABS
1000.0000 mg | ORAL_TABLET | Freq: Four times a day (QID) | ORAL | Status: AC
  Administered 2020-11-04 – 2020-11-05 (×4): 1000 mg via ORAL
  Filled 2020-11-04 (×5): qty 2

## 2020-11-04 MED ORDER — DOCUSATE SODIUM 100 MG PO CAPS
100.0000 mg | ORAL_CAPSULE | Freq: Two times a day (BID) | ORAL | Status: DC
  Administered 2020-11-04 – 2020-11-05 (×2): 100 mg via ORAL
  Filled 2020-11-04 (×2): qty 1

## 2020-11-04 MED ORDER — MIDAZOLAM HCL 2 MG/2ML IJ SOLN
INTRAMUSCULAR | Status: AC
  Filled 2020-11-04: qty 2

## 2020-11-04 MED ORDER — DOCUSATE SODIUM 100 MG PO CAPS: 100.0000 mg | ORAL_CAPSULE | Freq: Two times a day (BID) | ORAL | Status: DC

## 2020-11-04 MED ORDER — DEXAMETHASONE SODIUM PHOSPHATE 10 MG/ML IJ SOLN
INTRAMUSCULAR | Status: AC
  Filled 2020-11-04: qty 1

## 2020-11-04 MED ORDER — HYDROMORPHONE HCL 1 MG/ML IJ SOLN: 0.5000 mg | INTRAMUSCULAR | Status: DC | PRN

## 2020-11-04 SURGICAL SUPPLY — 65 items
APPLICATOR COTTON TIP 6 STRL (MISCELLANEOUS) ×3 IMPLANT
APPLICATOR COTTON TIP 6IN STRL (MISCELLANEOUS) ×5
CATH FOLEY 2WAY SLVR 18FR 30CC (CATHETERS) ×5 IMPLANT
CATH TIEMANN FOLEY 18FR 5CC (CATHETERS) ×5 IMPLANT
CHLORAPREP W/TINT 26 (MISCELLANEOUS) ×5 IMPLANT
CLIP VESOLOCK LG 6/CT PURPLE (CLIP) ×10 IMPLANT
CNTNR URN SCR LID CUP LEK RST (MISCELLANEOUS) ×3 IMPLANT
CONT SPEC 4OZ STRL OR WHT (MISCELLANEOUS) ×2
COVER SURGICAL LIGHT HANDLE (MISCELLANEOUS) ×5 IMPLANT
COVER TIP SHEARS 8 DVNC (MISCELLANEOUS) ×3 IMPLANT
COVER TIP SHEARS 8MM DA VINCI (MISCELLANEOUS) ×2
COVER WAND RF STERILE (DRAPES) IMPLANT
CUTTER ECHEON FLEX ENDO 45 340 (ENDOMECHANICALS) ×5 IMPLANT
DECANTER SPIKE VIAL GLASS SM (MISCELLANEOUS) ×5 IMPLANT
DERMABOND ADVANCED (GAUZE/BANDAGES/DRESSINGS) ×2
DERMABOND ADVANCED .7 DNX12 (GAUZE/BANDAGES/DRESSINGS) ×3 IMPLANT
DRAIN CHANNEL RND F F (WOUND CARE) IMPLANT
DRAPE ARM DVNC X/XI (DISPOSABLE) ×12 IMPLANT
DRAPE COLUMN DVNC XI (DISPOSABLE) ×3 IMPLANT
DRAPE DA VINCI XI ARM (DISPOSABLE) ×8
DRAPE DA VINCI XI COLUMN (DISPOSABLE) ×2
DRAPE SURG IRRIG POUCH 19X23 (DRAPES) ×5 IMPLANT
DRSG TEGADERM 4X4.75 (GAUZE/BANDAGES/DRESSINGS) ×5 IMPLANT
ELECT PENCIL ROCKER SW 15FT (MISCELLANEOUS) IMPLANT
ELECT REM PT RETURN 15FT ADLT (MISCELLANEOUS) ×5 IMPLANT
GAUZE 4X4 16PLY RFD (DISPOSABLE) IMPLANT
GAUZE SPONGE 2X2 8PLY STRL LF (GAUZE/BANDAGES/DRESSINGS) IMPLANT
GLOVE SURG ENC MOIS LTX SZ6.5 (GLOVE) ×5 IMPLANT
GLOVE SURG ENC TEXT LTX SZ7.5 (GLOVE) ×10 IMPLANT
GLOVE SURG UNDER POLY LF SZ7.5 (GLOVE) ×5 IMPLANT
GOWN STRL REUS W/TWL LRG LVL3 (GOWN DISPOSABLE) ×15 IMPLANT
HOLDER FOLEY CATH W/STRAP (MISCELLANEOUS) ×5 IMPLANT
IRRIG SUCT STRYKERFLOW 2 WTIP (MISCELLANEOUS) ×5
IRRIGATION SUCT STRKRFLW 2 WTP (MISCELLANEOUS) ×3 IMPLANT
IV LACTATED RINGERS 1000ML (IV SOLUTION) ×5 IMPLANT
KIT PROCEDURE DA VINCI SI (MISCELLANEOUS)
KIT PROCEDURE DVNC SI (MISCELLANEOUS) IMPLANT
KIT TURNOVER KIT A (KITS) ×5 IMPLANT
NEEDLE INSUFFLATION 14GA 120MM (NEEDLE) ×5 IMPLANT
NEEDLE SPNL 22GX7 QUINCKE BK (NEEDLE) ×5 IMPLANT
PACK ROBOT UROLOGY CUSTOM (CUSTOM PROCEDURE TRAY) ×5 IMPLANT
PAD POSITIONING PINK XL (MISCELLANEOUS) ×5 IMPLANT
PORT ACCESS TROCAR AIRSEAL 12 (TROCAR) ×3 IMPLANT
PORT ACCESS TROCAR AIRSEAL 5M (TROCAR) ×2
SEAL CANN UNIV 5-8 DVNC XI (MISCELLANEOUS) ×12 IMPLANT
SEAL XI 5MM-8MM UNIVERSAL (MISCELLANEOUS) ×8
SET TRI-LUMEN FLTR TB AIRSEAL (TUBING) ×5 IMPLANT
SOLUTION ELECTROLUBE (MISCELLANEOUS) ×5 IMPLANT
SPONGE GAUZE 2X2 STER 10/PKG (GAUZE/BANDAGES/DRESSINGS)
SPONGE LAP 4X18 RFD (DISPOSABLE) ×5 IMPLANT
STAPLE RELOAD 45 GRN (STAPLE) ×3 IMPLANT
STAPLE RELOAD 45MM GREEN (STAPLE) ×2
SUT ETHILON 3 0 PS 1 (SUTURE) ×5 IMPLANT
SUT MNCRL AB 4-0 PS2 18 (SUTURE) ×10 IMPLANT
SUT NOVA NAB DX-16 0-1 5-0 T12 (SUTURE) ×5 IMPLANT
SUT PDS AB 1 CT1 27 (SUTURE) IMPLANT
SUT VIC AB 2-0 SH 27 (SUTURE) ×2
SUT VIC AB 2-0 SH 27X BRD (SUTURE) ×3 IMPLANT
SUT VICRYL 0 UR6 27IN ABS (SUTURE) ×5 IMPLANT
SUT VLOC BARB 180 ABS3/0GR12 (SUTURE) ×15
SUTURE VLOC BRB 180 ABS3/0GR12 (SUTURE) ×9 IMPLANT
SYR 27GX1/2 1ML LL SAFETY (SYRINGE) ×5 IMPLANT
TOWEL OR NON WOVEN STRL DISP B (DISPOSABLE) ×5 IMPLANT
TROCAR XCEL NON-BLD 5MMX100MML (ENDOMECHANICALS) IMPLANT
WATER STERILE IRR 1000ML POUR (IV SOLUTION) ×5 IMPLANT

## 2020-11-04 NOTE — Discharge Instructions (Signed)

## 2020-11-04 NOTE — Transfer of Care (Signed)
Immediate Anesthesia Transfer of Care Note  Patient: [name]  Procedure(s) Performed: XI ROBOTIC ASSISTED LAPAROSCOPIC RADICAL PROSTATECTOMY WITH INJECTION OF INDOCYANINE GREEN DYE (N/A ) LYMPHADENECTOMY (Bilateral ) HERNIA REPAIR UMBILICAL ADULT  Patient Location: PACU  Anesthesia Type:General  Level of Consciousness: awake, alert , oriented and patient cooperative  Airway & Oxygen Therapy: Patient Spontanous Breathing and Patient connected to face mask oxygen  Post-op Assessment: Report given to RN, Post -op Vital signs reviewed and stable and Patient moving all extremities  Post vital signs: Reviewed and stable  Last Vitals:  Vitals Value Taken Time  BP 142/74 11/04/20 1545  Temp 36.5 C 11/04/20 1545  Pulse 72 11/04/20 1550  Resp 16 11/04/20 1550  SpO2 100 % 11/04/20 1550  Vitals shown include unvalidated device data.  Last Pain:  Vitals:   11/04/20 1021  TempSrc:   PainSc: 0-No pain         Complications: No complications documented.

## 2020-11-04 NOTE — H&P (Signed)
[name] is an 70 y.o. male.    Chief Complaint: Pre-Op Radical Prostatectomy  HPI:   1 - High Risk Prostate Cancer - 6/12 cores up to 70% grade 5 cancer by BX 2022 on eval rising PSA to 6.3. All Rt side positive, All Lt side negative. TRUS 41mL, no median. CT abd BS clinicaly localized. Tiny umbilcal hernia on exam.   PMH sig for bilateral knee replacement. No chest/abd surgeries. 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 His wife [name] is very involved and they enjoy exercise together  daily. His PCP is Dimas Aguas MD with [facility].   Today "[name]" is seen to proceed with radical prostatectomy. NO interval fevers. Hgb 15.4. C19 screen negative.    Past Medical History:  Diagnosis Date  . Cancer [facility])     Past Surgical History:  Procedure Laterality Date  . COLONOSCOPY    . JOINT REPLACEMENT     BILATERAL KNEE REPLACEMENTS IN 2009     No family history on file. Social History:  reports that he has never smoked. He has never used smokeless tobacco. He reports that he does not drink alcohol and does not use drugs.  Allergies: No Known Allergies  No medications prior to admission.    Results for orders placed or performed during the hospital encounter of 11/02/20 (from the past 48 hour(s))  SARS CORONAVIRUS 2 (TAT 6-24 HRS) Nasopharyngeal Nasopharyngeal Swab     Status: None   Collection Time: 11/02/20 10:44 AM   Specimen: Nasopharyngeal Swab  Result Value Ref Range   SARS Coronavirus 2 NEGATIVE NEGATIVE    Comment: (NOTE) SARS-CoV-2 target nucleic acids are NOT DETECTED.  The SARS-CoV-2 RNA is generally detectable in upper and lower respiratory specimens during the acute phase of infection. Negative results do not preclude SARS-CoV-2 infection, do not rule out co-infections with other pathogens, and should not be used as the sole basis for treatment or other patient management decisions. Negative results must be combined with clinical  observations, patient history, and epidemiological information. The expected result is Negative.  Fact Sheet for Patients: SugarRoll.be  Fact Sheet for Healthcare Providers: https://www.woods-mathews.com/  This test is not yet approved or cleared by the [place] FDA and  has been authorized for detection and/or diagnosis of SARS-CoV-2 by FDA under an Emergency Use Authorization (EUA). This EUA will remain  in effect (meaning this test can be used) for the duration of the COVID-19 declaration under Se ction 564(b)(1) of the Act, 21 U.S.C. section 360bbb-3(b)(1), unless the authorization is terminated or revoked sooner.  Performed at [facility], [place] [address] [place], [place] [address]    No results found.  Review of Systems  Constitutional: Negative for chills and fever.  All other systems reviewed and are negative.   There were no vitals taken for this visit. Physical Exam Vitals reviewed.  HENT:     Head: Normocephalic.     Nose: Nose normal.  Eyes:     Pupils: Pupils are equal, round, and reactive to light.  Cardiovascular:     Rate and Rhythm: Normal rate.     Pulses: Normal pulses.  Pulmonary:     Effort: Pulmonary effort is normal.  Abdominal:     General: Abdomen is flat.  Genitourinary:    Comments: NO CVAT at present.  Musculoskeletal:        General: Normal range of motion.     Cervical back: Normal range of motion.  Skin:    General: Skin is warm.  Psychiatric:        Mood and Affect: Mood normal.      Assessment/Plan Proceed as planned with robotic prostatectomy / node dissection for high risk cancer. Risks, benefits, alternatives, expected peri-op course discussed previously and reiterated today.   Alexis Frock, MD 11/04/2020, 7:28 AM

## 2020-11-04 NOTE — Anesthesia Procedure Notes (Addendum)
Procedure Name: Intubation Date/Time: 11/04/2020 12:31 PM Performed by: Catalina Gravel, MD Pre-anesthesia Checklist: Patient identified, Emergency Drugs available, Suction available and Patient being monitored Patient Re-evaluated:Patient Re-evaluated prior to induction Oxygen Delivery Method: Circle system utilized Preoxygenation: Pre-oxygenation with 100% oxygen Induction Type: IV induction Ventilation: Mask ventilation without difficulty Laryngoscope Size: Mac and 4 Grade View: Grade I Tube type: Oral Tube size: 7.5 mm Number of attempts: 1 Airway Equipment and Method: Stylet and Oral airway Placement Confirmation: ETT inserted through vocal cords under direct vision,  positive ETCO2 and breath sounds checked- equal and bilateral Secured at: 23 cm Tube secured with: Tape Dental Injury: Teeth and Oropharynx as per pre-operative assessment

## 2020-11-04 NOTE — Anesthesia Postprocedure Evaluation (Signed)
Anesthesia Post Note  Patient: Jeshua Ransford Peake  Procedure(s) Performed: XI ROBOTIC ASSISTED LAPAROSCOPIC RADICAL PROSTATECTOMY WITH INJECTION OF INDOCYANINE GREEN DYE (N/A ) LYMPHADENECTOMY (Bilateral ) HERNIA REPAIR UMBILICAL ADULT     Patient location during evaluation: PACU Anesthesia Type: General Level of consciousness: awake and alert Pain management: pain level controlled Vital Signs Assessment: post-procedure vital signs reviewed and stable Respiratory status: spontaneous breathing, nonlabored ventilation, respiratory function stable and patient connected to nasal cannula oxygen Cardiovascular status: blood pressure returned to baseline and stable Postop Assessment: no apparent nausea or vomiting Anesthetic complications: no   No complications documented.  Last Vitals:  Vitals:   11/04/20 1645 11/04/20 1728  BP: (!) 145/76 (!) 151/76  Pulse: 67 69  Resp: 13 18  Temp:  (!) 36.4 C  SpO2: 99% 100%    Last Pain:  Vitals:   11/04/20 1728  TempSrc: Oral  PainSc:                  Catalina Gravel

## 2020-11-04 NOTE — Anesthesia Preprocedure Evaluation (Addendum)
Anesthesia Evaluation  Patient identified by MRN, date of birth, ID band Patient awake    Reviewed: Allergy & Precautions, NPO status , Patient's Chart, lab work & pertinent test results  Airway Mallampati: I  TM Distance: >3 FB Neck ROM: Full    Dental  (+) Teeth Intact, Dental Advisory Given   Pulmonary neg pulmonary ROS,    Pulmonary exam normal breath sounds clear to auscultation       Cardiovascular negative cardio ROS Normal cardiovascular exam Rhythm:Regular Rate:Normal     Neuro/Psych negative neurological ROS     GI/Hepatic negative GI ROS, Neg liver ROS,   Endo/Other  negative endocrine ROS  Renal/GU negative Renal ROS   Prostate cancer     Musculoskeletal negative musculoskeletal ROS (+)   Abdominal   Peds  Hematology negative hematology ROS (+)   Anesthesia Other Findings Day of surgery medications reviewed with the patient.  Reproductive/Obstetrics                            Anesthesia Physical Anesthesia Plan  ASA: II  Anesthesia Plan: General   Post-op Pain Management:    Induction: Intravenous  PONV Risk Score and Plan: 3 and Midazolam, Dexamethasone and Ondansetron  Airway Management Planned: Oral ETT  Additional Equipment:   Intra-op Plan:   Post-operative Plan: Extubation in OR  Informed Consent: I have reviewed the patients History and Physical, chart, labs and discussed the procedure including the risks, benefits and alternatives for the proposed anesthesia with the patient or authorized representative who has indicated his/her understanding and acceptance.     Dental advisory given  Plan Discussed with: CRNA  Anesthesia Plan Comments: (2nd PIV after induction )        Anesthesia Quick Evaluation

## 2020-11-04 NOTE — Brief Op Note (Signed)
11/04/2020  3:35 PM  PATIENT:  [name]  70 y.o. male  PRE-OPERATIVE DIAGNOSIS:  PROSTATE CANCER  POST-OPERATIVE DIAGNOSIS:  PROSTATE CANCER  PROCEDURE:  Procedure(s) with comments: XI ROBOTIC ASSISTED LAPAROSCOPIC RADICAL PROSTATECTOMY WITH INJECTION OF INDOCYANINE GREEN DYE (N/A) - 3 HRS LYMPHADENECTOMY (Bilateral) HERNIA REPAIR UMBILICAL ADULT  SURGEON:  Surgeon(s) and Role:    * Alexis Frock, MD - Primary  PHYSICIAN ASSISTANT:   ASSISTANTS: Debbrah Alar PA   ANESTHESIA:   local and general  EBL:  200 mL   BLOOD ADMINISTERED:none  DRAINS: 1 - JP to bulb; 2- Foley to gravity   LOCAL MEDICATIONS USED:  MARCAINE     SPECIMEN:  Source of Specimen:  1 - periprostatic fat, 2- pelvic lymph nodes, 3 - prostatectomy, 4 - revised bladder neck  DISPOSITION OF SPECIMEN:  PATHOLOGY  COUNTS:  YES  TOURNIQUET:  * No tourniquets in log *  DICTATION: .Other Dictation: Dictation Number  [id number]  PLAN OF CARE: Admit for overnight observation  PATIENT DISPOSITION:  PACU - hemodynamically stable.   Delay start of Pharmacological VTE agent (>24hrs) due to surgical blood loss or risk of bleeding: yes

## 2020-11-05 ENCOUNTER — Encounter (HOSPITAL_COMMUNITY): Payer: Self-pay | Admitting: Urology

## 2020-11-05 ENCOUNTER — Other Ambulatory Visit (HOSPITAL_COMMUNITY): Payer: Self-pay

## 2020-11-05 DIAGNOSIS — C61 Malignant neoplasm of prostate: Secondary | ICD-10-CM | POA: Diagnosis not present

## 2020-11-05 LAB — HEMOGLOBIN AND HEMATOCRIT, BLOOD
HCT: 37.3 % — ABNORMAL LOW (ref 39.0–52.0)
Hemoglobin: 13.1 g/dL (ref 13.0–17.0)

## 2020-11-05 LAB — BASIC METABOLIC PANEL
Anion gap: 7 (ref 5–15)
BUN: 9 mg/dL (ref 8–23)
CO2: 24 mmol/L (ref 22–32)
Calcium: 8.8 mg/dL — ABNORMAL LOW (ref 8.9–10.3)
Chloride: 105 mmol/L (ref 98–111)
Creatinine, Ser: 1.01 mg/dL (ref 0.61–1.24)
GFR, Estimated: >60 mL/min (ref >60)
Glucose, Bld: 146 mg/dL — ABNORMAL HIGH (ref 70–99)
Potassium: 4.6 mmol/L (ref 3.5–5.1)
Sodium: 136 mmol/L (ref 135–145)

## 2020-11-05 MED ORDER — CHLORHEXIDINE GLUCONATE CLOTH 2 % EX PADS
6.0000 | MEDICATED_PAD | Freq: Every day | CUTANEOUS | Status: DC
  Administered 2020-11-05: 6 via TOPICAL

## 2020-11-05 NOTE — Progress Notes (Signed)
Pt and wife given and explained discharge instructions. IVs removed. Drain site teaching and supplies for home use given to the patient. Pt educated on foley catheter care and how to empty properly. Pt dressed in personal clothing. Pt taken to main entrance via wheelchair with all belongings and supplies needed for home use.

## 2020-11-05 NOTE — Progress Notes (Signed)
1 Day Post-Op   Subjective: Overall doing well. Tolerating a CLD without nausea/emesis. No flatus. Ambulated several times. Pain controlled.   Objective: Vital signs in last 24 hours: Temp:  [97.5 F (36.4 C)-98.2 F (36.8 C)] 97.9 F (36.6 C) (04/14 0520) Pulse Rate:  [64-79] 64 (04/14 0520) Resp:  [13-22] 15 (04/14 0520) BP: (116-169)/(59-87) 119/74 (04/14 0520) SpO2:  [94 %-100 %] 98 % (04/14 0520) Weight:  [112.4 kg] 112.4 kg (04/13 1020)  Intake/Output from previous day: 04/13 0701 - 04/14 0700 In: 4662.5 [P.O.:1080; I.V.:2966.4; IV Piggyback:616.2] Out: 3550 [Urine:3200; Drains:150; Blood:200] Intake/Output this shift: No intake/output data recorded.  Physical Exam:  General: Alert and oriented CV: Regular rate Lungs: NWOB on RA Abdomen: Soft, nondistended, appropriately tender. Incisions intact and covered with dermabond; no signs of infection. LLQ JP drain with minimal SS output  GU: Foley in place draining yellow urine  Extremities: Warm and well-perfused   Lab Results: Recent Labs    11/04/20 1553 11/05/20 0441  HGB 14.4 13.1  HCT 42.2 37.3*      Assessment/Plan: POD# 1 s/p robotic prostatectomy and bilateral pelvic lymph node dissection. Overall doing well.   - Advance diet as tolerated to regular. Stop IVF. Bowel regimen - IS, encourage ambulation, SCDs - Transition to oral analgesics  - Trend JP output. Anticipate removal prior to discharge - Continue foley catheter to drainage on discharge - EDD later today     LOS: 0 days   Celene Squibb 11/05/2020, 7:11 AM

## 2020-11-05 NOTE — Op Note (Signed)
NAME: [name], [name] MEDICAL RECORD NO: [id number] ACCOUNT NO: [id number] DATE OF BIRTH: 05/28/1951 FACILITY: [facility] LOCATION: [id number] PHYSICIAN: Alexis Frock, MD  Operative Report   DATE OF PROCEDURE: 11/04/2020  PREOPERATIVE DIAGNOSIS:  High risk prostate cancer.  Small umbilical hernia.  PROCEDURE PERFORMED:   1.  Robotic-assisted laparoscopic radical prostatectomy. 2.  Bilateral pelvic lymphadenectomy. 3.  Open umbilical hernia repair.  ESTIMATED BLOOD LOSS:  200 mL  ASSISTANT:  Debbrah Alar, PA  FINDINGS:   1.  Very muscular bladder neck.  Additional tissue taken to achieve negative margins. 2.  Small reducible umbilical hernia containing fat. 3.  No evidence of sentinel lymph nodes within the pelvis.  DRAINS:   1.  Jackson-Pratt drain was suctioned. 2.  Foley catheter to straight drain.  SPECIMENS:   1.  Radical prostatectomy. 2.  Periprostatic fat. 3.  Right external lymph nodes. 4.  Right obturator lymph nodes. 5.  Left external iliac lymph nodes. 6.  Left obturator lymph nodes. 7.  Radical prostatectomy. 8.  Right revised bladder neck margin. 9.  Final right bladder neck margin. 10.  Revised left bladder neck margin. 11.  Final left bladder neck margin.  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  He underwent staging imaging with CT and bone scan  that did not reveal any obviously locally advanced or distant disease.  Options were discussed.  He wished proceed with curative intent therapy with radical prostatectomy as primary therapy.  He also has a small reducible umbilical hernia.  Wishes to  have this addressed concomitantly.  Informed consent was signed and placed in the medical record.  DESCRIPTION OF PROCEDURE:  The patient being identified as the correct patient, the procedure being radical  prostatectomy with node dissection and umbilical hernia repair was confirmed.  Procedure timeout was performed.  Intravenous antibiotics  administered and general endotracheal anesthesia induced.  The patient was placed into the low lithotomy position.  Sterile field was created, prepped and draped the patient's penis, perineum and proximal thighs using Iodine and his infraxiphoid abdomen  using chlorhexidine gluconate after he was further fastened to the operating table using 3-inch tape over foam padding across supraxiphoid chest and his arms were tucked to his side using gel rolls.  A test steep Trendelenburg positioning was performed  and found to be suitably positioned.  Foley catheter was placed free for straight drain.  Next, a high-flow, low pressure, pneumoperitoneum was obtained using Veress technique in the supraumbilical midline, having passed the aspiration drop test.  An 8  mm robotic camera port was then placed in the same location.  Laparoscopic examination of the peritoneal cavity revealed no significant adhesions, no visceral injury.  There was some small fat within the area of the small umbilical hernia sac as  anticipated.  Additional ports were placed as follows:  Right paramedian 8 mm robotic port, right far lateral 12 mm AirSeal assist port, right paramedian 5 mm suction port, left paramedian 8 mm robotic port, left far lateral 8 mm robotic port.  Robot was  then docked and passed the electronic checks.  Attention was directed to development of the space of Retzius.  An incision was made lateral to the right medial umbilical ligament from the midline towards the area of the internal ring coursing along the  iliac vessels towards the area of the  right ureter, which was positively identified.  The right vas deferens was purposely ligated and used as a bucket handle and the right bladder wall was swept away from the pelvic sidewall towards the area of the  endopelvic fascia on the right  side.  A mirror image dissection was performed on the left side.  Anterior attachment was taken down using cautery scissors.  This inherently also reduced the perivesical fatty tissue, which was in the small umbilical  hernia sac.  This exposed the anterior base of the prostate, which was defatted to better denote the bladder neck prostate junction this fat set aside and labeled as periprosthetic fat.  Next, 0.2 mL of indocyanine green dye was injected into each lobe  of the prostate using a percutaneously placed robotically guided spinal needle with intervening suction to prevent dye spillage, which did not occur.  Next, the endopelvic fascia was swept away from the lateral aspect of the prostate in a base to apex  orientation bilaterally, exposed the dorsal venous complex was carefully controlled using a green load stapler, taking exquisite care to avoid membranous urethral injury, which did not occur.  It had been approximately 10 minutes post dye injection, the  pelvis was inspected under near infrared fluorescence light.  Sentinel lymphangiography revealed excellent parenchymal uptake of the prostate with several lymphatic channels seen coursing lateral to the left bladder with no obvious sentinel lymph nodes within the pelvic lymph node fields.    As such, a standard template lymphadenectomy was performed first on the right side, the right external iliac group with the boundaries being  the right external iliac artery, vein, pelvic sidewall, iliac bifurcation.  Lymphostasis achieved with cold clips, set aside and labeled as right external iliac lymph nodes.  Next, right obturator group was dissected free with the boundaries being the  right external iliac vein, pelvic sidewall, obturator nerve.  Lymphostasis was achieved with cold clips, set aside and labeled as right obturator lymph nodes.  Right obturator nerve was inspected following maneuvers and found to be uninjured.  Next, a  mirror image  lymphadenectomy was performed on the left side, the left external iliac and left obturator group respectively.  Similarly, the left obturator nerve was inspected following maneuvers and found to be uninjured.  Attention was then directed to  bladder neck dissection.  Bladder neck was identified by moving the Foley catheter back and forth and a lateral release was performed on each side to better denote the bladder neck prostate junction.  The bladder neck was quite muscular as is the patient  overall and this was quite thick with some mild asymmetry, right greater than left.  Final bladder neck dissection was performed in the anterior, posterior plane keeping what appeared to be a rim of circular muscle fibers in each plane of dissection.   Posterior dissection was performed by incising approximately 7 mm inferior posterior to the posterior lip of the prostate entering the plane of Denonvilliers.  Bilateral seminal vesicles and vas deferens were encountered.  The vas deferens were dissected  and reduced approximately 4 cm, ligated and placed on gentle superior retraction.  Bilateral seminal vesicles were dissected to their tips were placed on gentle superior retraction.  Dissection proceeded with the plane of Denonvilliers inferiorly  towards the area of the apex of the prostate.  This exposed the vascular pedicles on each side.  A purposeful wide dissection was performed on the right side using a sequential clipping technique and base to apex  orientation, which can control the right  prostate pedicle.  Similarly sequential clipping was performed on the left prostate pedicle, performing a partial nerve sparing on the left side given the paucity of disease on biopsy.  Final apical dissection was performed and the anterior plane placed  in the prostate and gentle superior traction and transected membranous urethra coldly.  This completely freed up the prostate, specimen was placed in EndoCatch bag for later  retrieval.  Next, digital rectal exam was performed using a indicator glove.  No  evidence for rectal violation was noted.  Posterior dissection was performed using a 3-0 V-Loc suture reapproximating the posterior urethral plate, the posterior bladder neck, bringing the strictures in tension-free apposition.  Further inspection of  the bladder neck area and membranous urethral stump again revealed significant hypertrophy of the bladder neck area.  I was mildly concerned about possible residual prostate tissue on the bladder neck side just given the volume of tissue and asymmetry.   However, grossly this did not have the appearance of such on the side of caution.  Additional bladder neck tissue was taken from each side, both to help best achieve a negative margin in this area and to better promote asymmetric and conical shaped  bladder neck for anastomosis and this nearly achieved both goals.  A final right, final left margin were additionally sent from the bladder neck tissue.  The bladder neck caliber still remained quite small and approximately 1 cm.  The urethra at the  bladder neck anastomosis was then performed using double-armed V-Loc suture from the 12 o'clock to 6 o'clock position circumferentially resulted in an excellent angiographic position and a Foley catheter was placed.  The straight drain was irrigated  quantitatively.  Sponge and needle counts were correct.  Hemostasis was excellent.  We achieved the extirpated portion of the procedure today.  A close suction drain was brought to the previous left lateral most robotic port site into the area of the  peritoneal cavity.  Robot was undocked.  The previous right lateral most 12 mm assistant port site was closed in fascia using Carter-Thomason suture passer with 0 Vicryl.  Specimen was retrieved by extending the previous camera port inferiorly into the  right side of the umbilicus removing the prostate and the specimen was then sent for permanent  pathology.  The extraction site, the umbilical hernia defect is better appreciated.  It was approximately 8 mm in diameter.  The fascia was circumferentially  mobilized and the small hernia sac was removed.  An umbilical hernia repair was performed using figure-of-eight Novafil suture x2 and then additional closure of the extraction site was performed using figure-of-eight Novafil x3 additional, this resulted  in excellent fascial apposition and resolution of the small umbilical hernia.  All incision sites were infiltrated with dilute lipolyzed Marcaine and closed the level of the skin using subcuticular Monocryl followed by Dermabond.  The extraction site was  initially closed at the level of Scarpa's using running Vicryl.  The procedure was terminated.  The patient tolerated the procedure well, no immediate complications.  The patient was taken to postanesthesia care in stable condition.  Plan for observation admission, likely discharged home tomorrow versus the following day, pending clinical progress.  Please note first assistant, Debbrah Alar was crucial for all portions of the procedure today.  She provided invaluable retraction, suctioning, specimen manipulation, lymphatic clipping, vascular clipping, vascular stapling  and general first assistance.   PUS D: 11/04/2020 3:46:19 pm T: 11/05/2020 8:02:00  am  JOB: [id number]

## 2020-11-05 NOTE — Discharge Summary (Addendum)
Date of admission: 11/04/2020  Date of discharge: 11/05/2020  Admission diagnosis: Prostate cancer  Discharge diagnosis: Prostate cancer   Secondary diagnoses: None  History and Physical: For full details, please see admission history and physical. Briefly, [name] is a 70 y.o. male with history of high-risk prostate cancer.   Hospital Course:  The patient underwent robotic radical prostatectomy with bilateral lymph node dissection on 11/04/20. A JP drain was placed intraoperatively. He tolerated the procedure well and was transferred to the floor after receiving routine post-operative care. His diet was gradually advanced, and his pain was controlled with oral analgesics.  By POD1, he was tolerating a regular diet, ambulating, having good urine output via foley catheter, and having pain well-controlled with oral analgesics. Thus, he was deemed appropriate for discharge home. His foley catheter was continued on discharge.  His JP drain had minimal output during his hospitalization, so it was removed prior to discharge.   Laboratory values:  Recent Labs    11/04/20 1553 11/05/20 0441  HGB 14.4 13.1  HCT 42.2 37.3*   Recent Labs    11/05/20 0441  CREATININE 1.01   Physical Exam:  General: Alert and oriented CV: Regular rate Lungs: NWOB on RA Abdomen: Soft, nondistended, appropriately tender. Incisions intact and covered with dermabond; no signs of infection. JP drain removed; site covered with clean, dry dressing  GU: Foley in place draining yellow urine without clots  Extremities: Warm and well-perfused   Disposition: Home  Discharge instruction: The patient was instructed to be ambulatory but told to refrain from heavy lifting, strenuous activity, or driving while on narcotics.   Discharge medications:  Allergies as of 11/05/2020   No Known Allergies     Medication List    STOP taking these medications   multivitamin with minerals Tabs tablet     TAKE these  medications   Azelastine HCl 137 MCG/SPRAY Soln Place 2 sprays into both nostrils daily.   cetirizine 10 MG tablet Commonly known as: ZYRTEC Take 10 mg by mouth daily.   docusate sodium 100 MG capsule Commonly known as: COLACE Take 1 capsule (100 mg total) by mouth 2 (two) times daily.   fluticasone 50 MCG/ACT nasal spray Commonly known as: FLONASE Place 2 sprays into both nostrils daily.   HYDROcodone-acetaminophen 5-325 MG tablet Commonly known as: Norco Take 1-2 tablets by mouth every 6 (six) hours as needed for moderate pain.   sulfamethoxazole-trimethoprim 800-160 MG tablet Commonly known as: BACTRIM DS Take 1 tablet by mouth 2 (two) times daily. Start the day prior to foley removal appointment        Followup:   Follow-up Information    Alexis Frock, MD On 11/16/2020.   Specialty: Urology Why: at 10 AM for MD visit and office catheter removal.  Contact information: [place] [address] [phone]

## 2020-11-12 LAB — SURGICAL PATHOLOGY

## 2021-09-06 NOTE — Progress Notes (Signed)
GU Location of Tumor / Histology: Prostate Ca  If Prostate Cancer, Gleason Score is (4 + 3) and PSA is (0.14 as of 07/2021)   Dr. Tresa Moore    Past/Anticipated interventions by urology, if any:   Weight changes, if any: No  IPSS:  1 SHIM:  5  Bowel/Bladder complaints, if any:  No bowel and No bladder.  Nausea/Vomiting, if any: No  Pain issues, if any:  0/10  SAFETY ISSUES: Prior radiation?  No Pacemaker/ICD? No Possible current pregnancy? Male Is the patient on methotrexate?  No  Current Complaints / other details:  Need more information on treatment options.

## 2021-09-07 ENCOUNTER — Ambulatory Visit
Admission: RE | Admit: 2021-09-07 | Discharge: 2021-09-07 | Disposition: A | Discharge status: Home / Self Care | Payer: Medicare PPO | Source: Ambulatory Visit | Attending: Radiation Oncology | Admitting: Radiation Oncology

## 2021-09-07 ENCOUNTER — Ambulatory Visit: Payer: Medicare PPO | Admitting: Radiation Oncology

## 2021-09-07 ENCOUNTER — Other Ambulatory Visit: Payer: Self-pay

## 2021-09-07 ENCOUNTER — Encounter: Payer: Self-pay | Admitting: Urology

## 2021-09-07 ENCOUNTER — Ambulatory Visit: Payer: Medicare PPO

## 2021-09-07 VITALS — Ht 73.0 in | Wt 243.0 lb

## 2021-09-07 DIAGNOSIS — R9721 Rising PSA following treatment for malignant neoplasm of prostate: Secondary | ICD-10-CM

## 2021-09-07 DIAGNOSIS — C61 Malignant neoplasm of prostate: Secondary | ICD-10-CM

## 2021-09-07 NOTE — Progress Notes (Signed)
Radiation Oncology         [phone] ________________________________  Initial Outpatient Consultation - Conducted via MyChart due to current COVID-19 concerns for limiting patient exposure  Name: [name] MRN: [id number]  Date: 09/07/2021  DOB: 24-May-1951  JA:[id number], Beverely Low, MD  Alexis Frock, MD   REFERRING PHYSICIAN: Alexis Frock, MD  DIAGNOSIS: 71 y.o. gentleman with a rising detectable postoperative PSA of 0.14 s/p RALP in 10/2020 for Stage pT3bN0, Gleason 4+5 prostate cancer.    ICD-10-CM   1. Biochemically recurrent malignant neoplasm of prostate [facility])  C61    R97.21       HISTORY OF PRESENT ILLNESS: [name] is a 71 y.o. male with a diagnosis of prostate cancer. He was initially referred to Dr. Milford Cage for an elevated PSA of 6.6. He was subsequently diagnosed with Gleason 4+5 prostate cancer on 08/18/20. Staging CT A/P and bone scan performed on 09/14/20 were negative for metastatic disease. He opted to proceed with RALP on 11/04/20 under Dr. Tresa Moore. Pathology revealed Gleason 4+5 prostatic adenocarcinoma with extraprostatic extension at right posterior base, right seminal vesicle invasion, and positive margin at right posterior mid. All 10 lymph nodes were negative (0/10). His postoperative PSA was barely detectable at 0.029. Repeat PSA in 07/2021 showed a jump to 0.14.  The patient reviewed the biopsy results with his urologist and he has kindly been referred today for discussion of potential radiation treatment options.   PREVIOUS RADIATION THERAPY: No  PAST MEDICAL HISTORY:  Past Medical History:  Diagnosis Date   Cancer [facility])       PAST SURGICAL HISTORY: Past Surgical History:  Procedure Laterality Date   COLONOSCOPY     JOINT REPLACEMENT     BILATERAL KNEE REPLACEMENTS IN 2009    LYMPHADENECTOMY Bilateral 11/04/2020   Procedure: LYMPHADENECTOMY;  Surgeon: Alexis Frock, MD;  Location: [facility];  Service: Urology;  Laterality: Bilateral;   ROBOT  ASSISTED LAPAROSCOPIC RADICAL PROSTATECTOMY N/A 11/04/2020   Procedure: XI ROBOTIC ASSISTED LAPAROSCOPIC RADICAL PROSTATECTOMY WITH INJECTION OF INDOCYANINE GREEN DYE;  Surgeon: Alexis Frock, MD;  Location: [facility];  Service: Urology;  Laterality: N/A;  3 HRS   UMBILICAL HERNIA REPAIR  11/04/2020   Procedure: HERNIA REPAIR UMBILICAL ADULT;  Surgeon: Alexis Frock, MD;  Location: [facility];  Service: Urology;;    FAMILY HISTORY: No family history on file.  SOCIAL HISTORY:  Social History   Socioeconomic History   Marital status: Married    Spouse name: Not on file   Number of children: Not on file   Years of education: Not on file   Highest education level: Not on file  Occupational History   Not on file  Tobacco Use   Smoking status: Never   Smokeless tobacco: Never  Vaping Use   Vaping Use: Never used  Substance and Sexual Activity   Alcohol use: Never   Drug use: Never   Sexual activity: Not on file  Other Topics Concern   Not on file  Social History Narrative   Not on file   Social Determinants of Health   Financial Resource Strain: Not on file  Food Insecurity: Not on file  Transportation Needs: Not on file  Physical Activity: Not on file  Stress: Not on file  Social Connections: Not on file  Intimate Partner Violence: Not on file    ALLERGIES: Patient has no known allergies.  MEDICATIONS:  Current Outpatient Medications  Medication Sig Dispense Refill   Black Elderberry 1.9 GM/5ML SYRP  cetirizine (ZYRTEC) 10 MG tablet Take 10 mg by mouth daily.     fluticasone (FLONASE) 50 MCG/ACT nasal spray Place 2 sprays into both nostrils daily.     montelukast (SINGULAIR) 10 MG tablet Take by mouth.     Multiple Vitamins-Minerals (MULTI ADULT GUMMIES) CHEW      sildenafil (REVATIO) 20 MG tablet 3 PO daily prior to sex prn     azelastine (ASTELIN) 0.1 % nasal spray Place 2 sprays into both nostrils 2 (two) times daily. (Patient not taking: Reported on 09/07/2021)      Azelastine HCl 137 MCG/SPRAY SOLN Place 2 sprays into both nostrils daily.     chlorpheniramine-HYDROcodone 10-8 MG/5ML Take by mouth. (Patient not taking: Reported on 09/07/2021)     docusate sodium (COLACE) 100 MG capsule Take 1 capsule (100 mg total) by mouth 2 (two) times daily.     HYDROcodone-acetaminophen (NORCO) 5-325 MG tablet Take 1-2 tablets by mouth every 6 (six) hours as needed for moderate pain. 20 tablet 0   PAXLOVID, 300/100, 20 x 150 MG & 10 x 100MG  TBPK Take by mouth. (Patient not taking: Reported on 09/07/2021)     predniSONE (DELTASONE) 10 MG tablet Take by mouth. (Patient not taking: Reported on 09/07/2021)     sulfamethoxazole-trimethoprim (BACTRIM DS) 800-160 MG tablet Take 1 tablet by mouth 2 (two) times daily. Start the day prior to foley removal appointment 6 tablet 0   No current facility-administered medications for this encounter.    REVIEW OF SYSTEMS:  On review of systems, the patient reports that he is doing well overall. He denies any chest pain, shortness of breath, cough, fevers, chills, night sweats, unintended weight changes. He denies any bowel disturbances, and denies abdominal pain, nausea or vomiting. He denies any new musculoskeletal or joint aches or pains. His IPSS was 1, indicating minimal urinary symptoms. 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 A complete review of systems is obtained and is otherwise negative.    PHYSICAL EXAM:  Wt Readings from Last 3 Encounters:  09/07/21 243 lb (110.2 kg)  11/04/20 247 lb 12.8 oz (112.4 kg)   Temp Readings from Last 3 Encounters:  11/05/20 98.2 F (36.8 C) (Oral)  10/28/20 98.6 F (37 C) (Oral)   BP Readings from Last 3 Encounters:  11/05/20 129/63  10/28/20 (!) 150/78   Pulse Readings from Last 3 Encounters:  11/05/20 71  10/28/20 61   Pain Assessment Pain Score: 0-No pain/10  In general this is a well appearing African American man in no acute distress. He's alert and  oriented x4 and appropriate throughout the examination. Cardiopulmonary assessment is negative for acute distress, and he exhibits normal effort.     KPS = 100  100 - Normal; no complaints; no evidence of disease. 90   - Able to carry on normal activity; minor signs or symptoms of disease. 80   - Normal activity with effort; some signs or symptoms of disease. 73   - Cares for self; unable to carry on normal activity or to do active work. 60   - Requires occasional assistance, but is able to care for most of his personal needs. 50   - Requires considerable assistance and frequent medical care. 25   - Disabled; requires special care and assistance. 22   - Severely disabled; hospital admission is indicated although death not imminent. 51   - Very sick; hospital admission necessary; active supportive treatment necessary. 10   - Moribund; fatal  processes progressing rapidly. 0     - Dead  Karnofsky DA, Abelmann [place], Craver LS and Burchenal [facility] [phone]) The use of the nitrogen mustards in the palliative treatment of carcinoma: with particular reference to bronchogenic carcinoma Cancer 1 634-56  LABORATORY DATA:  Lab Results  Component Value Date   WBC 4.6 10/28/2020   HGB 13.1 11/05/2020   HCT 37.3 (L) 11/05/2020   MCV 93.5 10/28/2020   PLT 150 10/28/2020   Lab Results  Component Value Date   NA 136 11/05/2020   K 4.6 11/05/2020   CL 105 11/05/2020   CO2 24 11/05/2020   Lab Results  Component Value Date   ALT 48 04/11/2008   AST 26 04/11/2008   ALKPHOS 92 04/11/2008   BILITOT 0.8 04/11/2008     RADIOGRAPHY: No results found.    IMPRESSION/PLAN: This visit was conducted via MyChart to spare the patient unnecessary potential exposure in the healthcare setting during the current COVID-19 pandemic. 1. 71 y.o. gentleman with a rising detectable postoperative PSA of 0.14 s/p RALP in 10/2020 for Stage pT3bN0, Gleason 4+5 prostate cancer. Today, we reviewed the findings and workup thus  far.  We discussed the natural history of prostate cancer.  We reviewed the the implications of positive margins, extracapsular extension, seminal vesicle involvement and detectable postoperative PSA, on the risk of prostate cancer recurrence. In his case, extraprostatic extension, seminal vesicle invasion, positive surgical margins and detectable postoperative PSA were all present. We reviewed some of the evidence suggesting an advantage for patients who undergo adjuvant radiotherapy in this setting in terms of disease control and overall survival. We also discussed some of the dilemmas related to the available evidence.  We discussed the SWOG trial which did show an improvement in disease-free survival as well as overall survival using adjuvant radiotherapy. However, we discussed the fact that the study did not carefully control the usage of adjuvant radiotherapy in the observation arm. We discussed radiation treatment directed to the prostatic fossa with regard to the logistics and delivery of external beam radiation treatment. We also discussed the role of ST-ADT in the setting of adjuvant treatment for high risk disease with multiple high risk features on pathology. Given his high grade disease, we would recommend ADT, at least for a short while. We reviewed the associated side effects that could be expected with this therapy and he was encouraged to ask questions that were answered to his stated satisfaction.  At the conclusion of our conversation, the patient is interested in proceeding with the recommended 7.5 week course of adjuvant external beam therapy concurrent with ST-ADT. We will share our discussion with Dr. Tresa Moore and make arrangements for a follow up visit, first available, to start ADT now. The patient appears to have a good understanding of his disease and our treatment recommendations which are of curative intent and is in agreement with the stated plan. He and his wife are going on a cruise  from 3/3-3/12 so he would like to get the ADT as soon as possible so that he can assess the degree of side effects and tolerability of these, prior to leaving the country. He would also like to proceed with CT simulation prior to their trip so that he is ready to start the daily adjuvant radiation treatments when he returns so we will move forward with treatment planning accordingly. We enjoyed meeting him and his wife today and look forward to continuing to participate in his care.   Given current  concerns for patient exposure during the COVID-19 pandemic, this encounter was conducted via video-enabled WebEx visit. The patient has given verbal consent for this type of encounter. The attendants for this meeting include Tyler Pita MD, Ashlyn Bruning PA-C, Katie Carbon, patient, MELANIE PELLOT and his wife. During the encounter, Tyler Pita MD, Ashlyn Bruning PA-C, and scribe, Wilburn Mylar were located at [place].  Patient, [name] and his wife were was located at home.  We personally spent 70 minutes in this encounter including chart review, reviewing radiological studies, meeting face-to-face with the patient, entering orders, coordinating care and completing documentation.    [name], PA-C    Tyler Pita, MD  [facility] Direct Dial: [phone]   Fax: [phone] [email or web address]   Skype   [email or web address]   This document serves as a record of services personally performed by Tyler Pita, MD and Freeman Caldron, PA-C. It was created on their behalf by Wilburn Mylar, a trained medical scribe. The creation of this record is based on the scribe's personal observations and the provider's statements to them. This document has been checked and approved by the attending provider.     References:  JAMA. 2006 Nov 15;296(19):2329-35.  Adjuvant radiotherapy for pathologically advanced  prostate cancer: a randomized clinical trial.  Grandville Silos IM Jr(1), Tangen CM, Hettie Holstein MS, Ova Freshwater, Messing Wayland Denis ED.  Author information: (1)Department of Urology, [facility] [place], [place], [address] [place] [address], [place]. [email or web address]  CONTEXT: Despite a stage-shift to earlier cancer stages and lower tumor volumes for prostate cancer, pathologically advanced disease is detected at radical prostatectomy in 38% to 52% of patients. However, the optimal management of these patients after radical prostatectomy is unknown.  OBJECTIVE: To determine whether adjuvant radiotherapy improves metastasis-free survival in patients with stage pT3 N0 M0 prostate cancer.  DESIGN, SETTING, AND PATIENTS: Randomized, prospective, multi-institutional, [place] clinical trial with enrollment between March 09, 1987, and July 26, 1995 (with database frozen for statistical analysis on April 14, 2004). Patients were 5 men with pathologically advanced prostate cancer who had undergone radical prostatectomy. INTERVENTION: Men were randomly assigned to receive 60 to 64 Gy of external beam radiotherapy delivered to the prostatic fossa (n = 214) or usual care plus observation (n = 211).  MAIN OUTCOME MEASURES: Primary outcome was metastasis-free survival, defined as time to first occurrence of metastatic disease or death due to any cause. Secondary outcomes included prostate-specific antigen (PSA) relapse, recurrence-free survival, overall survival, freedom from hormonal therapy, and postoperative complications.  RESULTS: Among the 425 men, median follow-up was 10.6 years (interquartile range, 9.2-12.7 years). For metastasis-free survival, 76 (35.5%) of 214 men in the adjuvant radiotherapy group were diagnosed with metastatic disease or died (median metastasis-free estimate, 14.7 years), compared  with 91 (43.1%) of 211 (median metastasis-free estimate, 13.2 years) of those in the observation group (hazard ratio [HR], 0.75; 95% CI, 0.55-1.02; P = .06). There were no significant between-group differences for overall survival (71 deaths, median survival of 14.7 years for radiotherapy vs 83 deaths, median survival of 13.8 years for observation; HR, 0.80; 95% CI, 0.58-1.09; P = .16). PSA relapse (median PSA relapse-free survival, 10.3 years for radiotherapy vs 3.1 years for observation; HR, 0.43; 95% CI, 0.31-0.58; P<.001) and disease recurrence (median recurrence-free survival, 13.8 years for radiotherapy vs 9.9 years  for observation; HR, 0.62; 95% CI, 0.46-0.82; P = .001) were both significantly reduced with radiotherapy. Adverse effects were more common with radiotherapy vs observation (23.8% vs 11.9%), including rectal complications (9.7% vs 0%), urethral strictures (17.8% vs 9.5%), and total urinary incontinence (6.5% vs 2.8%).  CONCLUSIONS: In men who had undergone radical prostatectomy for pathologically advanced prostate cancer, adjuvant radiotherapy resulted in significantly reduced risk of PSA relapse and disease recurrence, although the improvements in metastasis-free survival and overall survival were not statistically significant.  Trial Registration clinicaltrials.gov Identifier: [id number]. PMID: [id number]   J Clin Oncol. 2007 Jun 1;25(16):2225-9. Predominant treatment failure in postprostatectomy patients is local: analysis of patterns of treatment failure in SWOG 8794.  Swanson GP(1), [place] MA, Tangen CM, Chin J, Messing E, Canby-Hagino Daiva Eves, Doy Hutching ED;  Author information: (1)Department of Radiation Oncology and Urology, [facility] of [facility], [place] [address], [place]. [email or web address] Comment in [place]. 2007 Dec 10;25(35):5671-2.  PURPOSE: [employer] [facility] (SWOG) trial [phone] demonstrated that adjuvant radiation reduces  the risk of biochemical (prostate-specific antigen [PSA]) treatment failure by 50% over radical prostatectomy alone. In this analysis, we stratified patients as to their preradiation PSA levels and correlated it with outcomes such as PSA treatment failure, local recurrence, and distant failure, to serve as guidelines for future research.  PATIENTS AND METHODS: Four hundred thirty-one subjects with pathologically advanced prostate cancer (extraprostatic extension, positive surgical margins, or seminal vesicle invasion) were randomly assigned to adjuvant radiotherapy or observation.  RESULTS: Three hundred seventy-four eligible patients had immediate postprostatectomy and follow-up PSA data. Median follow-up was 10.2 years. For patients with a postsurgical PSA of 0.2 ng/mL, radiation was associated with reductions in the 10-year risk of biochemical treatment failure (72% to 42%), local failures (20% to 7%), and distant failures (12% to 4%). For patients with a postsurgical PSA between higher than 0.2 and <or = 1.0 ng/mL, reductions in the 10-year risk of biochemical failure (80% to 73%), local failures (25% to 9%), and distant failures (16% to 12%) were realized. In patients with postsurgical PSA higher than 1.0, the respective findings were 94% versus 100%, 28% versus 9%, and 44% versus 18%.  CONCLUSION: The pattern of treatment failure in high-risk patients is predominantly local with a surprisingly low incidence of metastatic failure. Adjuvant radiation to the prostate bed reduces the risk of metastatic disease and biochemical failure at all postsurgical PSA levels. Further improvement in reducing local treatment failure is likely to have the greatest impact on outcome in high-risk patients after prostatectomy.  PMID: [id number]     J Urol. 2009 Mar;181(3):956-62.  Adjuvant radiotherapy for pathological T3N0M0 prostate cancer significantly reduces risk of metastases and improves survival: long-term followup  of a randomized clinical trial.  Grandville Silos IM(1), Tangen CM, Hettie Holstein MS, Ova Freshwater, Messing Wayland Denis ED.  Chief Strategy Officer information: (1)[employer] [facility] [place], [place], [place], [place]. PURPOSE: Extraprostatic disease will be manifest in a third of men after radical prostatectomy. We present the long-term followup of a randomized clinical trial of radiotherapy to reduce the risk of subsequent metastatic disease and death.  MATERIALS AND METHODS: A total of 431 men with pT3N0M0 prostate cancer were randomized to 60 to 64 Gy adjuvant radiotherapy or observation. The primary study end point was metastasis-free survival.  RESULTS: Of 425 eligible men 211 were randomized to observation and 214 to adjuvant radiation. Of those  men under observation 70 ultimately received radiotherapy. Metastasis-free survival was significantly greater with radiotherapy (93 of 214 events on the radiotherapy arm vs 114 of 211 events on observation; HR 0.71; 95% CI 0.54, 0.94; p = 0.016). Survival improved significantly with adjuvant radiation (88 deaths of 214 on the radiotherapy arm vs 110 deaths of 211 on observation; HR 0.72; 95% CI 0.55, 0.96; p = 0.023).  CONCLUSIONS: Adjuvant radiotherapy after radical prostatectomy for a man with pT3N0M0 prostate cancer significantly reduces the risk of metastasis and increases survival.  PMCID: [id number] PMID: [id number]

## 2021-09-15 ENCOUNTER — Encounter: Payer: Self-pay | Admitting: Urology

## 2021-09-15 ENCOUNTER — Other Ambulatory Visit: Payer: Self-pay

## 2021-09-15 ENCOUNTER — Ambulatory Visit
Admission: RE | Admit: 2021-09-15 | Discharge: 2021-09-15 | Disposition: A | Discharge status: Home / Self Care | Payer: Medicare PPO | Source: Ambulatory Visit | Attending: Radiation Oncology | Admitting: Radiation Oncology

## 2021-09-15 DIAGNOSIS — C61 Malignant neoplasm of prostate: Secondary | ICD-10-CM

## 2021-09-15 DIAGNOSIS — R9721 Rising PSA following treatment for malignant neoplasm of prostate: Secondary | ICD-10-CM | POA: Insufficient documentation

## 2021-09-16 NOTE — Progress Notes (Signed)
°  Radiation Oncology         (336) [phone] ________________________________  Name: [name] MRN: [id number]  Date: 09/15/2021  DOB: 12-12-1950  SIMULATION AND TREATMENT PLANNING NOTE    ICD-10-CM   1. Biochemically recurrent malignant neoplasm of prostate [name]  C61    R97.21       DIAGNOSIS:  71 y.o. gentleman with a rising detectable postoperative PSA of 0.14 s/p RALP in 10/2020 for Stage pT3bN0, Gleason 4+5 prostate cancer.  NARRATIVE:  The patient was brought to the [place].  Identity was confirmed.  All relevant records and images related to the planned course of therapy were reviewed.  The patient freely provided informed written consent to proceed with treatment after reviewing the details related to the planned course of therapy. The consent form was witnessed and verified by the simulation staff.  Then, the patient was set-up in a stable reproducible supine position for radiation therapy.  A vacuum lock pillow device was custom fabricated to position his legs in a reproducible immobilized position.  Then, I performed a urethrogram under sterile conditions to identify the prostatic bed.  CT images were obtained.  Surface markings were placed.  The CT images were loaded into the planning software.  Then the prostate bed target, pelvic lymph node target and avoidance structures including the rectum, bladder, bowel and hips were contoured.  Treatment planning then occurred.  The radiation prescription was entered and confirmed.  A total of one complex treatment devices were fabricated. I have requested : Intensity Modulated Radiotherapy (IMRT) is medically necessary for this case for the following reason:  Rectal sparing.Marland Kitchen  PLAN:  The patient will receive 45 Gy in 25 fractions of 1.8 Gy, followed by a boost to the prostate bed to a total dose of 68.4 Gy with 13 additional fractions of 1.8 Gy.   ________________________________  Sheral Apley Tammi Klippel, M.D.

## 2021-09-20 NOTE — Progress Notes (Signed)
Patient started Biclutamide ADT 09/14/21 and will get a 6 month Eligard injection on 10/05/21.  [name], MMS, PA-C [place] [facility] [place]: [phone]   Fax: [phone]

## 2021-09-23 DIAGNOSIS — C61 Malignant neoplasm of prostate: Secondary | ICD-10-CM | POA: Diagnosis present

## 2021-09-23 DIAGNOSIS — R9721 Rising PSA following treatment for malignant neoplasm of prostate: Secondary | ICD-10-CM | POA: Diagnosis not present

## 2021-10-04 ENCOUNTER — Other Ambulatory Visit: Payer: Self-pay

## 2021-10-04 ENCOUNTER — Ambulatory Visit
Admission: RE | Admit: 2021-10-04 | Discharge: 2021-10-04 | Disposition: A | Discharge status: Home / Self Care | Payer: Medicare PPO | Source: Ambulatory Visit | Attending: Radiation Oncology | Admitting: Radiation Oncology

## 2021-10-04 DIAGNOSIS — C61 Malignant neoplasm of prostate: Secondary | ICD-10-CM | POA: Diagnosis not present

## 2021-10-05 ENCOUNTER — Ambulatory Visit
Admission: RE | Admit: 2021-10-05 | Discharge: 2021-10-05 | Disposition: A | Discharge status: Home / Self Care | Payer: Medicare PPO | Source: Ambulatory Visit | Attending: Radiation Oncology | Admitting: Radiation Oncology

## 2021-10-05 DIAGNOSIS — C61 Malignant neoplasm of prostate: Secondary | ICD-10-CM | POA: Diagnosis not present

## 2021-10-06 ENCOUNTER — Other Ambulatory Visit: Payer: Self-pay

## 2021-10-06 ENCOUNTER — Ambulatory Visit
Admission: RE | Admit: 2021-10-06 | Discharge: 2021-10-06 | Disposition: A | Discharge status: Home / Self Care | Payer: Medicare PPO | Source: Ambulatory Visit | Attending: Radiation Oncology | Admitting: Radiation Oncology

## 2021-10-06 DIAGNOSIS — C61 Malignant neoplasm of prostate: Secondary | ICD-10-CM | POA: Diagnosis not present

## 2021-10-07 ENCOUNTER — Ambulatory Visit
Admission: RE | Admit: 2021-10-07 | Discharge: 2021-10-07 | Disposition: A | Discharge status: Home / Self Care | Payer: Medicare PPO | Source: Ambulatory Visit | Attending: Radiation Oncology | Admitting: Radiation Oncology

## 2021-10-07 DIAGNOSIS — C61 Malignant neoplasm of prostate: Secondary | ICD-10-CM | POA: Diagnosis not present

## 2021-10-08 ENCOUNTER — Ambulatory Visit
Admission: RE | Admit: 2021-10-08 | Discharge: 2021-10-08 | Disposition: A | Discharge status: Home / Self Care | Payer: Medicare PPO | Source: Ambulatory Visit | Attending: Radiation Oncology | Admitting: Radiation Oncology

## 2021-10-08 DIAGNOSIS — C61 Malignant neoplasm of prostate: Secondary | ICD-10-CM | POA: Diagnosis not present

## 2021-10-08 NOTE — Progress Notes (Signed)
Pt here for patient teaching.   ? ?Pt given Radiation and You booklet and skin care instructions.   ? ?Reviewed areas of pertinence such as diarrhea, fatigue, hair loss, sexual and fertility changes, skin changes, and urinary and bladder changes .  ? ?Pt able to give teach back of to pat skin, use unscented/gentle soap, use baby wipes, have Imodium on hand, drink plenty of water, and sitz bath,avoid applying anything to skin within 4 hours of treatment.  ? ?Pt verbalizes understanding of information given and will contact nursing with any questions or concerns.   ? ? ? ? ? ? ?  ?

## 2021-10-11 ENCOUNTER — Ambulatory Visit
Admission: RE | Admit: 2021-10-11 | Discharge: 2021-10-11 | Disposition: A | Discharge status: Home / Self Care | Payer: Medicare PPO | Source: Ambulatory Visit | Attending: Radiation Oncology | Admitting: Radiation Oncology

## 2021-10-11 ENCOUNTER — Other Ambulatory Visit: Payer: Self-pay

## 2021-10-11 DIAGNOSIS — C61 Malignant neoplasm of prostate: Secondary | ICD-10-CM | POA: Diagnosis not present

## 2021-10-12 ENCOUNTER — Ambulatory Visit
Admission: RE | Admit: 2021-10-12 | Discharge: 2021-10-12 | Disposition: A | Discharge status: Home / Self Care | Payer: Medicare PPO | Source: Ambulatory Visit | Attending: Radiation Oncology | Admitting: Radiation Oncology

## 2021-10-12 DIAGNOSIS — C61 Malignant neoplasm of prostate: Secondary | ICD-10-CM | POA: Diagnosis not present

## 2021-10-13 ENCOUNTER — Ambulatory Visit
Admission: RE | Admit: 2021-10-13 | Discharge: 2021-10-13 | Disposition: A | Discharge status: Home / Self Care | Payer: Medicare PPO | Source: Ambulatory Visit | Attending: Radiation Oncology | Admitting: Radiation Oncology

## 2021-10-13 ENCOUNTER — Other Ambulatory Visit: Payer: Self-pay

## 2021-10-13 DIAGNOSIS — C61 Malignant neoplasm of prostate: Secondary | ICD-10-CM | POA: Diagnosis not present

## 2021-10-14 ENCOUNTER — Ambulatory Visit
Admission: RE | Admit: 2021-10-14 | Discharge: 2021-10-14 | Disposition: A | Discharge status: Home / Self Care | Payer: Medicare PPO | Source: Ambulatory Visit | Attending: Radiation Oncology | Admitting: Radiation Oncology

## 2021-10-14 DIAGNOSIS — C61 Malignant neoplasm of prostate: Secondary | ICD-10-CM | POA: Diagnosis not present

## 2021-10-15 ENCOUNTER — Other Ambulatory Visit: Payer: Self-pay

## 2021-10-15 ENCOUNTER — Ambulatory Visit
Admission: RE | Admit: 2021-10-15 | Discharge: 2021-10-15 | Disposition: A | Discharge status: Home / Self Care | Payer: Medicare PPO | Source: Ambulatory Visit | Attending: Radiation Oncology | Admitting: Radiation Oncology

## 2021-10-15 DIAGNOSIS — C61 Malignant neoplasm of prostate: Secondary | ICD-10-CM | POA: Diagnosis not present

## 2021-10-18 ENCOUNTER — Ambulatory Visit: Payer: Medicare PPO

## 2021-10-19 ENCOUNTER — Ambulatory Visit
Admission: RE | Admit: 2021-10-19 | Discharge: 2021-10-19 | Disposition: A | Discharge status: Home / Self Care | Payer: Medicare PPO | Source: Ambulatory Visit | Attending: Radiation Oncology | Admitting: Radiation Oncology

## 2021-10-19 ENCOUNTER — Other Ambulatory Visit: Payer: Self-pay

## 2021-10-19 DIAGNOSIS — C61 Malignant neoplasm of prostate: Secondary | ICD-10-CM | POA: Diagnosis not present

## 2021-10-20 ENCOUNTER — Ambulatory Visit
Admission: RE | Admit: 2021-10-20 | Discharge: 2021-10-20 | Disposition: A | Discharge status: Home / Self Care | Payer: Medicare PPO | Source: Ambulatory Visit | Attending: Radiation Oncology | Admitting: Radiation Oncology

## 2021-10-20 DIAGNOSIS — C61 Malignant neoplasm of prostate: Secondary | ICD-10-CM | POA: Diagnosis not present

## 2021-10-21 ENCOUNTER — Ambulatory Visit
Admission: RE | Admit: 2021-10-21 | Discharge: 2021-10-21 | Disposition: A | Discharge status: Home / Self Care | Payer: Medicare PPO | Source: Ambulatory Visit | Attending: Radiation Oncology | Admitting: Radiation Oncology

## 2021-10-21 ENCOUNTER — Other Ambulatory Visit: Payer: Self-pay

## 2021-10-21 DIAGNOSIS — C61 Malignant neoplasm of prostate: Secondary | ICD-10-CM | POA: Diagnosis not present

## 2021-10-22 ENCOUNTER — Ambulatory Visit
Admission: RE | Admit: 2021-10-22 | Discharge: 2021-10-22 | Disposition: A | Discharge status: Home / Self Care | Payer: Medicare PPO | Source: Ambulatory Visit | Attending: Radiation Oncology | Admitting: Radiation Oncology

## 2021-10-22 DIAGNOSIS — C61 Malignant neoplasm of prostate: Secondary | ICD-10-CM | POA: Diagnosis not present

## 2021-10-25 ENCOUNTER — Ambulatory Visit
Admission: RE | Admit: 2021-10-25 | Discharge: 2021-10-25 | Disposition: A | Discharge status: Home / Self Care | Payer: Medicare PPO | Source: Ambulatory Visit | Attending: Radiation Oncology | Admitting: Radiation Oncology

## 2021-10-25 ENCOUNTER — Other Ambulatory Visit: Payer: Self-pay

## 2021-10-25 DIAGNOSIS — C61 Malignant neoplasm of prostate: Secondary | ICD-10-CM | POA: Insufficient documentation

## 2021-10-25 DIAGNOSIS — R9721 Rising PSA following treatment for malignant neoplasm of prostate: Secondary | ICD-10-CM | POA: Insufficient documentation

## 2021-10-26 ENCOUNTER — Ambulatory Visit
Admission: RE | Admit: 2021-10-26 | Discharge: 2021-10-26 | Disposition: A | Discharge status: Home / Self Care | Payer: Medicare PPO | Source: Ambulatory Visit | Attending: Radiation Oncology | Admitting: Radiation Oncology

## 2021-10-26 DIAGNOSIS — C61 Malignant neoplasm of prostate: Secondary | ICD-10-CM | POA: Diagnosis not present

## 2021-10-27 ENCOUNTER — Other Ambulatory Visit: Payer: Self-pay

## 2021-10-27 ENCOUNTER — Ambulatory Visit
Admission: RE | Admit: 2021-10-27 | Discharge: 2021-10-27 | Disposition: A | Discharge status: Home / Self Care | Payer: Medicare PPO | Source: Ambulatory Visit | Attending: Radiation Oncology | Admitting: Radiation Oncology

## 2021-10-27 DIAGNOSIS — C61 Malignant neoplasm of prostate: Secondary | ICD-10-CM | POA: Diagnosis not present

## 2021-10-28 ENCOUNTER — Ambulatory Visit
Admission: RE | Admit: 2021-10-28 | Discharge: 2021-10-28 | Disposition: A | Discharge status: Home / Self Care | Payer: Medicare PPO | Source: Ambulatory Visit | Attending: Radiation Oncology | Admitting: Radiation Oncology

## 2021-10-28 DIAGNOSIS — C61 Malignant neoplasm of prostate: Secondary | ICD-10-CM | POA: Diagnosis not present

## 2021-10-29 ENCOUNTER — Ambulatory Visit
Admission: RE | Admit: 2021-10-29 | Discharge: 2021-10-29 | Disposition: A | Discharge status: Home / Self Care | Payer: Medicare PPO | Source: Ambulatory Visit | Attending: Radiation Oncology | Admitting: Radiation Oncology

## 2021-10-29 ENCOUNTER — Other Ambulatory Visit: Payer: Self-pay

## 2021-10-29 DIAGNOSIS — C61 Malignant neoplasm of prostate: Secondary | ICD-10-CM | POA: Diagnosis not present

## 2021-11-01 ENCOUNTER — Ambulatory Visit
Admission: RE | Admit: 2021-11-01 | Discharge: 2021-11-01 | Disposition: A | Discharge status: Home / Self Care | Payer: Medicare PPO | Source: Ambulatory Visit | Attending: Radiation Oncology | Admitting: Radiation Oncology

## 2021-11-01 ENCOUNTER — Other Ambulatory Visit: Payer: Self-pay

## 2021-11-01 DIAGNOSIS — C61 Malignant neoplasm of prostate: Secondary | ICD-10-CM | POA: Diagnosis not present

## 2021-11-02 ENCOUNTER — Ambulatory Visit
Admission: RE | Admit: 2021-11-02 | Discharge: 2021-11-02 | Disposition: A | Discharge status: Home / Self Care | Payer: Medicare PPO | Source: Ambulatory Visit | Attending: Radiation Oncology | Admitting: Radiation Oncology

## 2021-11-02 DIAGNOSIS — C61 Malignant neoplasm of prostate: Secondary | ICD-10-CM | POA: Diagnosis not present

## 2021-11-03 ENCOUNTER — Ambulatory Visit
Admission: RE | Admit: 2021-11-03 | Discharge: 2021-11-03 | Disposition: A | Discharge status: Home / Self Care | Payer: Medicare PPO | Source: Ambulatory Visit | Attending: Radiation Oncology | Admitting: Radiation Oncology

## 2021-11-03 DIAGNOSIS — C61 Malignant neoplasm of prostate: Secondary | ICD-10-CM | POA: Diagnosis not present

## 2021-11-04 ENCOUNTER — Other Ambulatory Visit: Payer: Self-pay

## 2021-11-04 ENCOUNTER — Ambulatory Visit
Admission: RE | Admit: 2021-11-04 | Discharge: 2021-11-04 | Disposition: A | Discharge status: Home / Self Care | Payer: Medicare PPO | Source: Ambulatory Visit | Attending: Radiation Oncology | Admitting: Radiation Oncology

## 2021-11-04 DIAGNOSIS — C61 Malignant neoplasm of prostate: Secondary | ICD-10-CM | POA: Diagnosis not present

## 2021-11-05 ENCOUNTER — Ambulatory Visit
Admission: RE | Admit: 2021-11-05 | Discharge: 2021-11-05 | Disposition: A | Discharge status: Home / Self Care | Payer: Medicare PPO | Source: Ambulatory Visit | Attending: Radiation Oncology | Admitting: Radiation Oncology

## 2021-11-05 DIAGNOSIS — C61 Malignant neoplasm of prostate: Secondary | ICD-10-CM | POA: Diagnosis not present

## 2021-11-07 ENCOUNTER — Ambulatory Visit: Payer: Medicare PPO

## 2021-11-08 ENCOUNTER — Ambulatory Visit
Admission: RE | Admit: 2021-11-08 | Discharge: 2021-11-08 | Disposition: A | Discharge status: Home / Self Care | Payer: Medicare PPO | Source: Ambulatory Visit | Attending: Radiation Oncology | Admitting: Radiation Oncology

## 2021-11-08 ENCOUNTER — Ambulatory Visit: Payer: Medicare PPO

## 2021-11-08 ENCOUNTER — Other Ambulatory Visit: Payer: Self-pay

## 2021-11-08 DIAGNOSIS — C61 Malignant neoplasm of prostate: Secondary | ICD-10-CM | POA: Diagnosis not present

## 2021-11-09 ENCOUNTER — Ambulatory Visit
Admission: RE | Admit: 2021-11-09 | Discharge: 2021-11-09 | Disposition: A | Discharge status: Home / Self Care | Payer: Medicare PPO | Source: Ambulatory Visit | Attending: Radiation Oncology | Admitting: Radiation Oncology

## 2021-11-09 ENCOUNTER — Other Ambulatory Visit: Payer: Self-pay

## 2021-11-09 ENCOUNTER — Ambulatory Visit: Payer: Medicare PPO

## 2021-11-09 DIAGNOSIS — C61 Malignant neoplasm of prostate: Secondary | ICD-10-CM | POA: Diagnosis not present

## 2021-11-09 LAB — RAD ONC ARIA SESSION SUMMARY
Course Elapsed Days: 36
Plan Fractions Treated to Date: 1
Plan Prescribed Dose Per Fraction: 1.8 Gy
Plan Total Fractions Prescribed: 13
Plan Total Prescribed Dose: 23.4 Gy
Reference Point Dosage Given to Date: 46.8 Gy
Reference Point Session Dosage Given: 1.8 Gy
Session Number: 26

## 2021-11-10 ENCOUNTER — Other Ambulatory Visit: Payer: Self-pay

## 2021-11-10 ENCOUNTER — Ambulatory Visit
Admission: RE | Admit: 2021-11-10 | Discharge: 2021-11-10 | Disposition: A | Discharge status: Home / Self Care | Payer: Medicare PPO | Source: Ambulatory Visit | Attending: Radiation Oncology | Admitting: Radiation Oncology

## 2021-11-10 DIAGNOSIS — C61 Malignant neoplasm of prostate: Secondary | ICD-10-CM | POA: Diagnosis not present

## 2021-11-10 LAB — RAD ONC ARIA SESSION SUMMARY
Course Elapsed Days: 37
Plan Fractions Treated to Date: 2
Plan Prescribed Dose Per Fraction: 1.8 Gy
Plan Total Fractions Prescribed: 13
Plan Total Prescribed Dose: 23.4 Gy
Reference Point Dosage Given to Date: 48.6 Gy
Reference Point Session Dosage Given: 1.8 Gy
Session Number: 27

## 2021-11-11 ENCOUNTER — Other Ambulatory Visit: Payer: Self-pay

## 2021-11-11 ENCOUNTER — Ambulatory Visit
Admission: RE | Admit: 2021-11-11 | Discharge: 2021-11-11 | Disposition: A | Discharge status: Home / Self Care | Payer: Medicare PPO | Source: Ambulatory Visit | Attending: Radiation Oncology | Admitting: Radiation Oncology

## 2021-11-11 DIAGNOSIS — C61 Malignant neoplasm of prostate: Secondary | ICD-10-CM | POA: Diagnosis not present

## 2021-11-11 LAB — RAD ONC ARIA SESSION SUMMARY
Course Elapsed Days: 38
Plan Fractions Treated to Date: 3
Plan Prescribed Dose Per Fraction: 1.8 Gy
Plan Total Fractions Prescribed: 13
Plan Total Prescribed Dose: 23.4 Gy
Reference Point Dosage Given to Date: 50.4 Gy
Reference Point Session Dosage Given: 1.8 Gy
Session Number: 28

## 2021-11-12 ENCOUNTER — Other Ambulatory Visit: Payer: Self-pay

## 2021-11-12 ENCOUNTER — Ambulatory Visit
Admission: RE | Admit: 2021-11-12 | Discharge: 2021-11-12 | Disposition: A | Discharge status: Home / Self Care | Payer: Medicare PPO | Source: Ambulatory Visit | Attending: Radiation Oncology | Admitting: Radiation Oncology

## 2021-11-12 DIAGNOSIS — C61 Malignant neoplasm of prostate: Secondary | ICD-10-CM | POA: Diagnosis not present

## 2021-11-12 LAB — RAD ONC ARIA SESSION SUMMARY
Course Elapsed Days: 39
Plan Fractions Treated to Date: 4
Plan Prescribed Dose Per Fraction: 1.8 Gy
Plan Total Fractions Prescribed: 13
Plan Total Prescribed Dose: 23.4 Gy
Reference Point Dosage Given to Date: 52.2 Gy
Reference Point Session Dosage Given: 1.8 Gy
Session Number: 29

## 2021-11-15 ENCOUNTER — Other Ambulatory Visit: Payer: Self-pay

## 2021-11-15 ENCOUNTER — Ambulatory Visit
Admission: RE | Admit: 2021-11-15 | Discharge: 2021-11-15 | Disposition: A | Discharge status: Home / Self Care | Payer: Medicare PPO | Source: Ambulatory Visit | Attending: Radiation Oncology | Admitting: Radiation Oncology

## 2021-11-15 DIAGNOSIS — C61 Malignant neoplasm of prostate: Secondary | ICD-10-CM | POA: Diagnosis not present

## 2021-11-15 LAB — RAD ONC ARIA SESSION SUMMARY
Course Elapsed Days: 42
Plan Fractions Treated to Date: 5
Plan Prescribed Dose Per Fraction: 1.8 Gy
Plan Total Fractions Prescribed: 13
Plan Total Prescribed Dose: 23.4 Gy
Reference Point Dosage Given to Date: 54 Gy
Reference Point Session Dosage Given: 1.8 Gy
Session Number: 30

## 2021-11-16 ENCOUNTER — Ambulatory Visit
Admission: RE | Admit: 2021-11-16 | Discharge: 2021-11-16 | Disposition: A | Discharge status: Home / Self Care | Payer: Medicare PPO | Source: Ambulatory Visit | Attending: Radiation Oncology | Admitting: Radiation Oncology

## 2021-11-16 ENCOUNTER — Other Ambulatory Visit: Payer: Self-pay

## 2021-11-16 DIAGNOSIS — C61 Malignant neoplasm of prostate: Secondary | ICD-10-CM | POA: Diagnosis not present

## 2021-11-16 LAB — RAD ONC ARIA SESSION SUMMARY
Course Elapsed Days: 43
Plan Fractions Treated to Date: 6
Plan Prescribed Dose Per Fraction: 1.8 Gy
Plan Total Fractions Prescribed: 13
Plan Total Prescribed Dose: 23.4 Gy
Reference Point Dosage Given to Date: 55.8 Gy
Reference Point Session Dosage Given: 1.8 Gy
Session Number: 31

## 2021-11-17 ENCOUNTER — Other Ambulatory Visit: Payer: Self-pay

## 2021-11-17 ENCOUNTER — Ambulatory Visit
Admission: RE | Admit: 2021-11-17 | Discharge: 2021-11-17 | Disposition: A | Discharge status: Home / Self Care | Payer: Medicare PPO | Source: Ambulatory Visit | Attending: Radiation Oncology | Admitting: Radiation Oncology

## 2021-11-17 DIAGNOSIS — C61 Malignant neoplasm of prostate: Secondary | ICD-10-CM | POA: Diagnosis not present

## 2021-11-17 LAB — RAD ONC ARIA SESSION SUMMARY
Course Elapsed Days: 44
Plan Fractions Treated to Date: 7
Plan Prescribed Dose Per Fraction: 1.8 Gy
Plan Total Fractions Prescribed: 13
Plan Total Prescribed Dose: 23.4 Gy
Reference Point Dosage Given to Date: 57.6 Gy
Reference Point Session Dosage Given: 1.8 Gy
Session Number: 32

## 2021-11-18 ENCOUNTER — Other Ambulatory Visit: Payer: Self-pay

## 2021-11-18 ENCOUNTER — Ambulatory Visit
Admission: RE | Admit: 2021-11-18 | Discharge: 2021-11-18 | Disposition: A | Discharge status: Home / Self Care | Payer: Medicare PPO | Source: Ambulatory Visit | Attending: Radiation Oncology | Admitting: Radiation Oncology

## 2021-11-18 DIAGNOSIS — C61 Malignant neoplasm of prostate: Secondary | ICD-10-CM | POA: Diagnosis not present

## 2021-11-18 LAB — RAD ONC ARIA SESSION SUMMARY
Course Elapsed Days: 45
Plan Fractions Treated to Date: 8
Plan Prescribed Dose Per Fraction: 1.8 Gy
Plan Total Fractions Prescribed: 13
Plan Total Prescribed Dose: 23.4 Gy
Reference Point Dosage Given to Date: 59.4 Gy
Reference Point Session Dosage Given: 1.8 Gy
Session Number: 33

## 2021-11-19 ENCOUNTER — Ambulatory Visit
Admission: RE | Admit: 2021-11-19 | Discharge: 2021-11-19 | Disposition: A | Discharge status: Home / Self Care | Payer: Medicare PPO | Source: Ambulatory Visit | Attending: Radiation Oncology | Admitting: Radiation Oncology

## 2021-11-19 ENCOUNTER — Other Ambulatory Visit: Payer: Self-pay

## 2021-11-19 DIAGNOSIS — C61 Malignant neoplasm of prostate: Secondary | ICD-10-CM | POA: Diagnosis not present

## 2021-11-19 LAB — RAD ONC ARIA SESSION SUMMARY
Course Elapsed Days: 46
Plan Fractions Treated to Date: 9
Plan Prescribed Dose Per Fraction: 1.8 Gy
Plan Total Fractions Prescribed: 13
Plan Total Prescribed Dose: 23.4 Gy
Reference Point Dosage Given to Date: 61.2 Gy
Reference Point Session Dosage Given: 1.8 Gy
Session Number: 34

## 2021-11-22 ENCOUNTER — Ambulatory Visit
Admission: RE | Admit: 2021-11-22 | Discharge: 2021-11-22 | Disposition: A | Discharge status: Home / Self Care | Payer: Medicare PPO | Source: Ambulatory Visit | Attending: Radiation Oncology | Admitting: Radiation Oncology

## 2021-11-22 ENCOUNTER — Other Ambulatory Visit: Payer: Self-pay

## 2021-11-22 DIAGNOSIS — R9721 Rising PSA following treatment for malignant neoplasm of prostate: Secondary | ICD-10-CM | POA: Insufficient documentation

## 2021-11-22 DIAGNOSIS — C61 Malignant neoplasm of prostate: Secondary | ICD-10-CM | POA: Diagnosis present

## 2021-11-22 LAB — RAD ONC ARIA SESSION SUMMARY
Course Elapsed Days: 49
Plan Fractions Treated to Date: 10
Plan Prescribed Dose Per Fraction: 1.8 Gy
Plan Total Fractions Prescribed: 13
Plan Total Prescribed Dose: 23.4 Gy
Reference Point Dosage Given to Date: 63 Gy
Reference Point Session Dosage Given: 1.8 Gy
Session Number: 35

## 2021-11-23 ENCOUNTER — Other Ambulatory Visit: Payer: Self-pay

## 2021-11-23 ENCOUNTER — Ambulatory Visit
Admission: RE | Admit: 2021-11-23 | Discharge: 2021-11-23 | Disposition: A | Discharge status: Home / Self Care | Payer: Medicare PPO | Source: Ambulatory Visit | Attending: Radiation Oncology | Admitting: Radiation Oncology

## 2021-11-23 DIAGNOSIS — C61 Malignant neoplasm of prostate: Secondary | ICD-10-CM | POA: Diagnosis not present

## 2021-11-23 LAB — RAD ONC ARIA SESSION SUMMARY
Course Elapsed Days: 50
Plan Fractions Treated to Date: 11
Plan Prescribed Dose Per Fraction: 1.8 Gy
Plan Total Fractions Prescribed: 13
Plan Total Prescribed Dose: 23.4 Gy
Reference Point Dosage Given to Date: 64.8 Gy
Reference Point Session Dosage Given: 1.8 Gy
Session Number: 36

## 2021-11-24 ENCOUNTER — Other Ambulatory Visit: Payer: Self-pay

## 2021-11-24 ENCOUNTER — Ambulatory Visit
Admission: RE | Admit: 2021-11-24 | Discharge: 2021-11-24 | Disposition: A | Discharge status: Home / Self Care | Payer: Medicare PPO | Source: Ambulatory Visit | Attending: Radiation Oncology | Admitting: Radiation Oncology

## 2021-11-24 DIAGNOSIS — C61 Malignant neoplasm of prostate: Secondary | ICD-10-CM | POA: Diagnosis not present

## 2021-11-24 LAB — RAD ONC ARIA SESSION SUMMARY
Course Elapsed Days: 51
Plan Fractions Treated to Date: 12
Plan Prescribed Dose Per Fraction: 1.8 Gy
Plan Total Fractions Prescribed: 13
Plan Total Prescribed Dose: 23.4 Gy
Reference Point Dosage Given to Date: 66.6 Gy
Reference Point Session Dosage Given: 1.8 Gy
Session Number: 37

## 2021-11-25 ENCOUNTER — Encounter: Payer: Self-pay | Admitting: Urology

## 2021-11-25 ENCOUNTER — Other Ambulatory Visit: Payer: Self-pay

## 2021-11-25 ENCOUNTER — Ambulatory Visit
Admission: RE | Admit: 2021-11-25 | Discharge: 2021-11-25 | Disposition: A | Discharge status: Home / Self Care | Payer: Medicare PPO | Source: Ambulatory Visit | Attending: Radiation Oncology | Admitting: Radiation Oncology

## 2021-11-25 DIAGNOSIS — R9721 Rising PSA following treatment for malignant neoplasm of prostate: Secondary | ICD-10-CM

## 2021-11-25 DIAGNOSIS — C61 Malignant neoplasm of prostate: Secondary | ICD-10-CM | POA: Diagnosis not present

## 2021-11-25 LAB — RAD ONC ARIA SESSION SUMMARY
Course Elapsed Days: 52
Plan Fractions Treated to Date: 13
Plan Prescribed Dose Per Fraction: 1.8 Gy
Plan Total Fractions Prescribed: 13
Plan Total Prescribed Dose: 23.4 Gy
Reference Point Dosage Given to Date: 68.4 Gy
Reference Point Session Dosage Given: 1.8 Gy
Session Number: 38

## 2022-01-04 ENCOUNTER — Encounter: Payer: Self-pay | Admitting: Urology

## 2022-01-04 NOTE — Progress Notes (Signed)
Telephone appointment. I verified patient's identity and began nursing interview. Patient is doing well. No issues reported at this time.  Meaningful use complete. I-PSS score of 2-mild No urinary management medications. Urology appointment- December, 2023.  Reminded patient of his 9:30am-01/05/22 telephone appointment w/ Ashlyn Bruning PA-C. I left my extension [phone] in case patient needs anything. Patient verbalized understanding.  Patient contact [phone]

## 2022-01-05 ENCOUNTER — Ambulatory Visit
Admission: RE | Admit: 2022-01-05 | Discharge: 2022-01-05 | Disposition: A | Discharge status: Home / Self Care | Payer: Medicare PPO | Source: Ambulatory Visit | Attending: Urology | Admitting: Urology

## 2022-01-05 DIAGNOSIS — R9721 Rising PSA following treatment for malignant neoplasm of prostate: Secondary | ICD-10-CM

## 2022-01-05 NOTE — Progress Notes (Signed)
Radiation Oncology         ([phone]) [phone] ________________________________  Name: [name] MRN: [id number]  Date: 01/05/2022  DOB: [birth year]  Post Treatment Note  CC: Johnathan Late, MD  Johnathan Late, MD  Diagnosis:  71 y.o. gentleman with a rising detectable postoperative PSA of 0.14 s/p RALP in 10/2020 for Stage pT3bN0, Gleason 4+5 prostate cancer.  Interval Since Last Radiation:  6 weeks; concurrent with ST-ADT(biclutamide 09/14/21 and 6 mo Eligard 10/05/21)   10/04/21 - 11/25/21: 1. The prostate fossa and pelvic lymph nodes were initially treated to 45 Gy in 25 fractions of 1.8 Gy  2. The prostate fossa only was boosted to 68.4 Gy with 13 additional fractions of 1.8 Gy   Narrative:  I spoke with the patient to conduct his routine scheduled 1 month follow up visit via telephone to spare the patient unnecessary potential exposure in the healthcare setting during the current COVID-19 pandemic.  The patient was notified in advance and gave permission to proceed with this visit format.  He tolerated radiation treatment relatively well.   The patient experienced some minor urinary irritation with nocturia 2-3x/night, increased frequency and occasional hesitancy. He also reported modest fatigue but denied any abdominal pain or bowel issues.                              On review of systems, the patient states that he is doing very well in general and is currently without complaints.  He specifically denies dysuria, gross hematuria, straining to void, incomplete bladder emptying or incontinence.  He reports a healthy appetite and is maintaining his weight.  He denies abdominal pain, nausea, vomiting, diarrhea or constipation.  He continues to tolerate the ADT fairly well aside from persistent decreased stamina and occasional hot flashes and overall, is quite pleased with his progress to date.  ALLERGIES:  has No Known Allergies.  Meds: Current Outpatient Medications  Medication Sig  Dispense Refill   azelastine (ASTELIN) 0.1 % nasal spray Place 2 sprays into both nostrils 2 (two) times daily. (Patient not taking: Reported on 09/07/2021)     Azelastine HCl 137 MCG/SPRAY SOLN Place 2 sprays into both nostrils daily.     Black Elderberry 1.9 GM/5ML SYRP      cetirizine (ZYRTEC) 10 MG tablet Take 10 mg by mouth daily.     chlorpheniramine-HYDROcodone 10-8 MG/5ML Take by mouth. (Patient not taking: Reported on 09/07/2021)     docusate sodium (COLACE) 100 MG capsule Take 1 capsule (100 mg total) by mouth 2 (two) times daily.     fluticasone (FLONASE) 50 MCG/ACT nasal spray Place 2 sprays into both nostrils daily.     HYDROcodone-acetaminophen (NORCO) 5-325 MG tablet Take 1-2 tablets by mouth every 6 (six) hours as needed for moderate pain. 20 tablet 0   montelukast (SINGULAIR) 10 MG tablet Take by mouth.     Multiple Vitamins-Minerals (MULTI ADULT GUMMIES) CHEW      PAXLOVID, 300/100, 20 x 150 MG & 10 x '100MG'$  TBPK Take by mouth. (Patient not taking: Reported on 09/07/2021)     sildenafil (REVATIO) 20 MG tablet 3 PO daily prior to sex prn     sulfamethoxazole-trimethoprim (BACTRIM DS) 800-160 MG tablet Take 1 tablet by mouth 2 (two) times daily. Start the day prior to foley removal appointment 6 tablet 0   No current facility-administered medications for this encounter.    Physical Findings:  vitals were not  taken for this visit.  Pain Assessment Pain Score: 0-No pain/10 Unable to assess due to telephone follow up visit format.  Lab Findings: Lab Results  Component Value Date   WBC 4.6 10/28/2020   HGB 13.1 11/05/2020   HCT 37.3 (L) 11/05/2020   MCV 93.5 10/28/2020   PLT 150 10/28/2020     Radiographic Findings: No results found.  Impression/Plan: 1.         71 y.o. gentleman with a rising detectable postoperative PSA of 0.14 s/p RALP in 10/2020 for Stage pT3bN0, Gleason 4+5 prostate cancer. He will continue to follow up with urology for ongoing PSA determinations  and has an appointment scheduled with Dr. Tresa Moore in 06/2022.  He had a recent lab visit on 12/21/2021 and a follow-up visit with Dr. Tresa Moore on 12/27/2021 but apparently there had been some lab error and they were still awaiting the results of his most recent PSA test.  He is hoping to hear from them soon with those results.  He understands what to expect with regards to PSA monitoring going forward. I will look forward to following his response to treatment via correspondence with urology, and would be happy to continue to participate in his care if clinically indicated. I talked to the patient about what to expect in the future, including his risk for erectile dysfunction and rectal bleeding. I encouraged him to call or return to the office if he has any questions regarding his previous radiation or possible radiation side effects. He was comfortable with this plan and will follow up as needed.     Johnathan Johns, PA-C

## 2022-01-05 NOTE — Progress Notes (Addendum)
  Radiation Oncology         [phone] ________________________________  Name: [name] MRN: [id number]  Date: 11/25/2021  DOB: 11/10/1950  End of Treatment Note  Diagnosis:   71 y.o. gentleman with a rising detectable postoperative PSA of 0.14 s/p RALP in 10/2020 for Stage pT3bN0, Gleason 4+5 prostate cancer.     Indication for treatment:  Curative, Definitive Radiotherapy concurrent with ST-ADT(biclutamide 09/14/21 and 6 mo Eligard 10/05/21)       Radiation treatment dates:   10/04/21 - 11/25/21  Site/dose:  1. The prostate fossa and pelvic lymph nodes were initially treated to 45 Gy in 25 fractions of 1.8 Gy  2. The prostate fossa only was boosted to 68.4 Gy with 13 additional fractions of 1.8 Gy   Beams/energy:  1. The prostate fossa  and pelvic lymph nodes were initially treated using VMAT intensity modulated radiotherapy delivering 6 megavolt photons. Image guidance was performed with CB-CT studies prior to each fraction. He was immobilized with a body fix lower extremity mold.  2. The prostate fossa only was boosted using VMAT intensity modulated radiotherapy delivering 6 megavolt photons. Image guidance was performed with CB-CT studies prior to each fraction. He was immobilized with a body fix lower extremity mold.  Narrative: The patient tolerated radiation treatment relatively well.   The patient experienced some minor urinary irritation with nocturia 2-3x/night, increased frequency and occasional hesitancy. He also reported modest fatigue but denied any abdominal pain or bowel issues.  Plan: The patient has completed radiation treatment. He will return to radiation oncology clinic for routine followup in one month. I advised him to call or return sooner if he has any questions or concerns related to his recovery or treatment. ________________________________  Sheral Apley. Tammi Klippel, M.D.

## 2022-01-28 ENCOUNTER — Encounter: Payer: Self-pay | Admitting: *Deleted

## 2022-02-11 ENCOUNTER — Inpatient Hospital Stay: Payer: Medicare PPO | Attending: Adult Health | Admitting: *Deleted

## 2022-02-11 DIAGNOSIS — C61 Malignant neoplasm of prostate: Secondary | ICD-10-CM

## 2022-02-11 DIAGNOSIS — R9721 Rising PSA following treatment for malignant neoplasm of prostate: Secondary | ICD-10-CM

## 2022-02-14 ENCOUNTER — Encounter: Payer: Self-pay | Admitting: *Deleted

## 2022-02-14 NOTE — Progress Notes (Signed)
2 Identifiers were used for verification purposes only. No vital signs were taken as this was a telephone visit. Pt denies pain today. Allergies and medications reviewed and updated. Pt denies smoking nor drinking. Pt states he is feeling very well and getting back to his regular routine. Pt enjoys exercising with wife. They belong to a gym and pt goes 5 days out of the week. He does cardio and weight-training. Pt did say his stamina is getting better.Pt does not have any urinary incontinence or urgency. "Urine stream is almost back to normal", he states. Bowels are normal. He is sleeping well and says he only gets up to bathroom once or twice a night. Pt will see PCP this year for annual check. Last colonoscopy was 2020. Vaccines are updated. SCP reviewed and completed.

## 2024-07-30 ENCOUNTER — Ambulatory Visit

## 2024-07-30 DIAGNOSIS — D124 Benign neoplasm of descending colon: Secondary | ICD-10-CM | POA: Diagnosis not present

## 2024-07-30 DIAGNOSIS — D123 Benign neoplasm of transverse colon: Secondary | ICD-10-CM | POA: Diagnosis not present

## 2024-07-30 DIAGNOSIS — K5730 Diverticulosis of large intestine without perforation or abscess without bleeding: Secondary | ICD-10-CM | POA: Diagnosis not present

## 2024-07-30 DIAGNOSIS — Z860100 Personal history of colon polyps, unspecified: Secondary | ICD-10-CM | POA: Diagnosis not present

## 2024-07-30 DIAGNOSIS — D122 Benign neoplasm of ascending colon: Secondary | ICD-10-CM | POA: Diagnosis not present

## 2024-07-30 DIAGNOSIS — Z09 Encounter for follow-up examination after completed treatment for conditions other than malignant neoplasm: Secondary | ICD-10-CM | POA: Diagnosis not present
